# Patient Record
Sex: Male | Born: 1995 | Race: Black or African American | Hispanic: No | Marital: Single | Smoking: Current every day smoker
Health system: Southern US, Community
[De-identification: ages and names within clinical notes are randomized; demographics above are authoritative.]

## PROBLEM LIST (undated history)

## (undated) ENCOUNTER — Ambulatory Visit (HOSPITAL_COMMUNITY): Admission: EM

## (undated) HISTORY — PX: APPENDECTOMY: SHX54

---
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
## 2014-01-16 ENCOUNTER — Emergency Department: Payer: Self-pay

## 2014-07-24 ENCOUNTER — Emergency Department: Payer: Self-pay | Admitting: Emergency Medicine

## 2014-07-25 ENCOUNTER — Emergency Department: Payer: Self-pay | Admitting: Emergency Medicine

## 2014-07-27 ENCOUNTER — Emergency Department: Payer: Self-pay | Admitting: Emergency Medicine

## 2014-07-28 ENCOUNTER — Emergency Department (HOSPITAL_COMMUNITY)
Admission: EM | Admit: 2014-07-28 | Discharge: 2014-07-28 | Disposition: A | Discharge status: Home / Self Care | Payer: Medicaid Other | Attending: Emergency Medicine | Admitting: Emergency Medicine

## 2014-07-28 ENCOUNTER — Encounter (HOSPITAL_COMMUNITY): Payer: Self-pay | Admitting: Emergency Medicine

## 2014-07-28 DIAGNOSIS — L02619 Cutaneous abscess of unspecified foot: Secondary | ICD-10-CM | POA: Diagnosis not present

## 2014-07-28 DIAGNOSIS — M79609 Pain in unspecified limb: Secondary | ICD-10-CM | POA: Insufficient documentation

## 2014-07-28 DIAGNOSIS — L02611 Cutaneous abscess of right foot: Secondary | ICD-10-CM

## 2014-07-28 DIAGNOSIS — L03119 Cellulitis of unspecified part of limb: Principal | ICD-10-CM

## 2014-07-28 MED ORDER — LIDOCAINE HCL (PF) 2 % IJ SOLN
2.0000 mL | Freq: Once | INTRAMUSCULAR | Status: AC
  Administered 2014-07-28: 2 mL

## 2014-07-28 NOTE — ED Notes (Signed)
Dr. Glick at bedside.  

## 2014-07-28 NOTE — ED Provider Notes (Signed)
CSN: [id number]     Arrival date & time 07/28/14  4098 History   First MD Initiated Contact with Patient 07/28/14 [phone]     Chief Complaint  Patient presents with  . Foot Pain    Right     (Consider location/radiation/quality/duration/timing/severity/associated sxs/prior Treatment) Patient is a 18 y.o. male presenting with lower extremity pain. The history is provided by the patient.  Foot Pain  He developed a blister on the sole of his right foot. He went to another emergency department where they put a needle into it and started to drain some purulent material but he states it started hurting to procedure consent back and he laughed. He was placed on an antibiotic. Since then, and the swollen area has gotten larger and is more painful. He rates pain at 10/10. He denies fever or chills.  History reviewed. No pertinent past medical history. Past Surgical History  Procedure Laterality Date  . Appendectomy     No family history on file. History  Substance Use Topics  . Smoking status: Never Smoker   . Smokeless tobacco: Not on file  . Alcohol Use: No    Review of Systems  All other systems reviewed and are negative.     Allergies  Review of patient's allergies indicates no known allergies.  Home Medications   Prior to Admission medications   Not on File   BP 147/76  Pulse 54  Temp(Src) 98 F (36.7 C) (Oral)  Resp 17  Ht 6' (1.829 m)  Wt 166 lb (75.297 kg)  BMI 22.51 kg/m2  SpO2 100% Physical Exam  Nursing note and vitals reviewed.  18 year old male, resting comfortably and in no acute distress. Vital signs are significant for bradycardia and hypertension. Oxygen saturation is 100%, which is normal. Head is normocephalic and atraumatic. PERRLA, EOMI. Oropharynx is clear. Neck is nontender and supple without adenopathy or JVD. Back is nontender and there is no CVA tenderness. Lungs are clear without rales, wheezes, or rhonchi. Chest is nontender. Heart has  regular rate and rhythm without murmur. Abdomen is soft, flat, nontender without masses or hepatosplenomegaly and peristalsis is normoactive. Extremities have no cyanosis or edema, full range of motion is present. There is a raised, fluctuant area on the plantar surface of the right foot overlying the second and third MTP joints. There is no erythema or warmth. Skin is warm and dry without rash. Neurologic: Mental status is normal, cranial nerves are intact, there are no motor or sensory deficits.  ED Course  Procedures (including critical care time) INCISION AND DRAINAGE Performed by: YNWGN,FAOZH Consent: Verbal consent obtained. Risks and benefits: risks, benefits and alternatives were discussed Type: abscess  Body area: right foot  Anesthesia: local infiltration  Incision was made with a scalpel.  Local anesthetic: lidocaine 2% without epinephrine  Anesthetic total: 2 ml  Complexity: simple  Drainage: purulent  Drainage amount: moderate  Packing material: none  Patient tolerance: Patient tolerated the procedure well with no immediate complications.    MDM   Final diagnoses:  Abscess of right foot excluding toes    Blister on the plantar surface of the right foot. However, since there is a report of some purulent material having been obtained for him at, attempts will be made to aspirate this.    Dione Booze, MD 07/28/14 [id number]

## 2014-07-28 NOTE — Discharge Instructions (Signed)
Continue taking the antibiotic you were prescribed yesterday. Soak the foot in warm water several times a day.   Abscess An abscess is an infected area that contains a collection of pus and debris.It can occur in almost any part of the body. An abscess is also known as a furuncle or boil. CAUSES  An abscess occurs when tissue gets infected. This can occur from blockage of oil or sweat glands, infection of hair follicles, or a minor injury to the skin. As the body tries to fight the infection, pus collects in the area and creates pressure under the skin. This pressure causes pain. People with weakened immune systems have difficulty fighting infections and get certain abscesses more often.  SYMPTOMS Usually an abscess develops on the skin and becomes a painful mass that is red, warm, and tender. If the abscess forms under the skin, you may feel a moveable soft area under the skin. Some abscesses break open (rupture) on their own, but most will continue to get worse without care. The infection can spread deeper into the body and eventually into the bloodstream, causing you to feel ill.  DIAGNOSIS  Your caregiver will take your medical history and perform a physical exam. A sample of fluid may also be taken from the abscess to determine what is causing your infection. TREATMENT  Your caregiver may prescribe antibiotic medicines to fight the infection. However, taking antibiotics alone usually does not cure an abscess. Your caregiver may need to make a small cut (incision) in the abscess to drain the pus. In some cases, gauze is packed into the abscess to reduce pain and to continue draining the area. HOME CARE INSTRUCTIONS   Only take over-the-counter or prescription medicines for pain, discomfort, or fever as directed by your caregiver.  If you were prescribed antibiotics, take them as directed. Finish them even if you start to feel better.  If gauze is used, follow your caregiver's directions for  changing the gauze.  To avoid spreading the infection:  Keep your draining abscess covered with a bandage.  Wash your hands well.  Do not share personal care items, towels, or whirlpools with others.  Avoid skin contact with others.  Keep your skin and clothes clean around the abscess.  Keep all follow-up appointments as directed by your caregiver. SEEK MEDICAL CARE IF:   You have increased pain, swelling, redness, fluid drainage, or bleeding.  You have muscle aches, chills, or a general ill feeling.  You have a fever. MAKE SURE YOU:   Understand these instructions.  Will watch your condition.  Will get help right away if you are not doing well or get worse. Document Released: 08/02/2005 Document Revised: 04/23/2012 Document Reviewed: 01/05/2012 [employer] Patient Information 2015 [place], [place]. This information is not intended to replace advice given to you by your health care provider. Make sure you discuss any questions you have with your health care provider.

## 2014-07-28 NOTE — ED Notes (Signed)
Pt coming from home with c/o of right foot pain with a bole on the sole of the foot.  Pt was seen at [facility] yesterday with similar c/o.  Pt states the bole has gotten bigger since yesterday.

## 2016-04-04 ENCOUNTER — Emergency Department: Payer: Medicaid Other

## 2016-04-04 ENCOUNTER — Emergency Department
Admission: EM | Admit: 2016-04-04 | Discharge: 2016-04-04 | Disposition: A | Discharge status: Home / Self Care | Payer: Medicaid Other | Attending: Emergency Medicine | Admitting: Emergency Medicine

## 2016-04-04 ENCOUNTER — Encounter: Payer: Self-pay | Admitting: Emergency Medicine

## 2016-04-04 DIAGNOSIS — S99922A Unspecified injury of left foot, initial encounter: Secondary | ICD-10-CM | POA: Diagnosis present

## 2016-04-04 DIAGNOSIS — Y939 Activity, unspecified: Secondary | ICD-10-CM | POA: Insufficient documentation

## 2016-04-04 DIAGNOSIS — Y929 Unspecified place or not applicable: Secondary | ICD-10-CM | POA: Insufficient documentation

## 2016-04-04 DIAGNOSIS — W228XXA Striking against or struck by other objects, initial encounter: Secondary | ICD-10-CM | POA: Diagnosis not present

## 2016-04-04 DIAGNOSIS — F17200 Nicotine dependence, unspecified, uncomplicated: Secondary | ICD-10-CM | POA: Diagnosis not present

## 2016-04-04 DIAGNOSIS — S92311A Displaced fracture of first metatarsal bone, right foot, initial encounter for closed fracture: Secondary | ICD-10-CM | POA: Insufficient documentation

## 2016-04-04 DIAGNOSIS — Y999 Unspecified external cause status: Secondary | ICD-10-CM | POA: Insufficient documentation

## 2016-04-04 DIAGNOSIS — S9031XA Contusion of right foot, initial encounter: Secondary | ICD-10-CM

## 2016-04-04 MED ORDER — TRAMADOL HCL 50 MG PO TABS: 50.0000 mg | ORAL_TABLET | Freq: Four times a day (QID) | ORAL | Status: DC | PRN

## 2016-04-04 NOTE — ED Provider Notes (Signed)
[facility] Emergency Department Provider Note  ____________________________________________  Time seen: Approximately 7:07 AM  I have reviewed the triage vital signs and the nursing notes.   HISTORY  Chief Complaint Foot Pain    HPI Steven Deleon is a 20 y.o. male , NAD, presents to the emergency department with one-day history of right foot pain. States that a grill fell over and onto his right foot yesterday. Has pain about the top part of the right foot as well as around the arch. Has been able to bear weight but with pain. Denies any numbness, weakness, tingling. Does not have any open wounds or lacerations to the foot. Has not completed any supportive care at this time. Denies any previous injuries. Denies pain about the right toes, ankle, lower leg.   History reviewed. No pertinent past medical history.  There are no active problems to display for this patient.   Past Surgical History  Procedure Laterality Date  . Appendectomy      Current Outpatient Rx  Name  Route  Sig  Dispense  Refill  . traMADol (ULTRAM) 50 MG tablet   Oral   Take 1 tablet (50 mg total) by mouth every 6 (six) hours as needed.   6 tablet   0     Allergies Review of patient's allergies indicates no known allergies.  No family history on file.  Social History Social History  Substance Use Topics  . Smoking status: Current Every Day Smoker  . Smokeless tobacco: None  . Alcohol Use: No     Review of Systems  Constitutional: No fatigue Cardiovascular: No chest pain. Respiratory: No shortness of breath.  Musculoskeletal: Positive right foot pain. Negative right toes, ankle, lower leg pain. Skin: Negative for rash, bruising, swelling, redness, open wounds, skin sores. Neurological: Negative for headaches, focal weakness or numbness. No tingling   ____________________________________________   PHYSICAL EXAM:  VITAL SIGNS: ED Triage Vitals  Enc  Vitals Group     BP --      Pulse --      Resp --      Temp --      Temp src --      SpO2 --      Weight --      Height --      Head Cir --      Peak Flow --      Pain Score 04/04/16 0706 7     Pain Loc --      Pain Edu? --      Excl. in GC? --      Constitutional: Alert and oriented. Well appearing and in no acute distress. Eyes: Conjunctivae are normal.  Head: Atraumatic. Cardiovascular: Good peripheral circulation with 2+ pulses noted in the right lower extremity. Capillary refill is brisk in the right lower extremity. Respiratory: Normal respiratory effort without tachypnea or retractions.  Musculoskeletal: Tenderness to palpation about the first, second, and third metacarpals without any bony abnormality or crepitus. Full range of motion of the right toes and ankle with mild pain about the top of the right foot medially. No pain about the sole of the foot with palpation. No lower extremity tenderness nor edema.  No joint effusions. Neurologic:  Normal speech and language. No gross focal neurologic deficits are appreciated. Sensation is grossly intact about the right lower extremity to light touch. Skin:  Skin is warm, dry and intact. No rash, redness, bruising, swelling, open wounds or lacerations noted. Psychiatric:  Mood and affect are normal. Speech and behavior are normal. Patient exhibits appropriate insight and judgement.   ____________________________________________   LABS  None ____________________________________________  EKG  None ____________________________________________  RADIOLOGY I have personally viewed and evaluated these images (plain radiographs) as part of my medical decision making, as well as reviewing the written report by the radiologist.  Dg Foot Complete Right  04/04/2016  CLINICAL DATA:  20 year old who had a grill fall onto the right foot yesterday. Dorsal and medial pain. Initial encounter. EXAM: RIGHT FOOT COMPLETE - 3+ VIEW COMPARISON:   07/24/2014. FINDINGS: Calcifications adjacent to the medial cuneiform-1st metatarsal joint with overlying soft tissue swelling. No evidence of acute fracture or dislocation elsewhere. Well preserved bone mineral density. No other intrinsic osseous abnormalities. IMPRESSION: Calcification involving the capsule of the medial cuneiform-1st metatarsal joint versus avulsion fracture arising from the medial cuneiform. Please correlate with point tenderness. Otherwise normal examination. Electronically Signed   By: Hulan Saashomas  Lawrence M.D.   On: 04/04/2016 07:27    ____________________________________________    PROCEDURES  Procedure(s) performed: None   Medications - No data to display   ____________________________________________   INITIAL IMPRESSION / ASSESSMENT AND PLAN / ED COURSE  Pertinent imaging results that were available during my care of the patient were reviewed by me and considered in my medical decision making (see chart for details).  Patient's diagnosis is consistent with avulsion first metatarsal. Patient was placed in a posterior Ortho-Glass splint and given crutches to assist in ambulation. Patient will be discharged home with instructions to take Tylenol or ibuprofen as needed for pain. Patient was given a prescription for Ultram to use sparingly as needed for severe pain. Should apply ice to the affected area 20 minutes 3-4 times daily and keep it elevated as needed. Patient is to follow up with Dr. Rosita KeaMenz, in orthopedics, in 3 days for recheck. Patient is given ED precautions to return to the ED for any worsening or new symptoms.    ____________________________________________  FINAL CLINICAL IMPRESSION(S) / ED DIAGNOSES  Final diagnoses:  Fracture of first metatarsal bone, right, closed, initial encounter  Contusion of right foot, initial encounter      NEW MEDICATIONS STARTED DURING THIS VISIT:  New Prescriptions   TRAMADOL (ULTRAM) 50 MG TABLET    Take 1  tablet (50 mg total) by mouth every 6 (six) hours as needed.         Hope PigeonJami L Charbel Los, PA-C 04/04/16 [id number]  Richardean Canalavid H Yao, MD 04/04/16 2158

## 2016-04-04 NOTE — ED Notes (Signed)
States he had a grill fall onto right foot yesterday  Having pain to top of foot

## 2016-04-04 NOTE — Discharge Instructions (Signed)
Foot Contusion A foot contusion is a deep bruise to the foot. Contusions are the result of an injury that caused bleeding under the skin. The contusion may turn blue, purple, or yellow. Minor injuries will give you a painless contusion, but more severe contusions may stay painful and swollen for a few weeks. CAUSES  A foot contusion comes from a direct blow to that area, such as a heavy object falling on the foot. SYMPTOMS   Swelling of the foot.  Discoloration of the foot.  Tenderness or soreness of the foot. DIAGNOSIS  You will have a physical exam and will be asked about your history. You may need an X-ray of your foot to look for a broken bone (fracture).  TREATMENT  An elastic wrap may be recommended to support your foot. Resting, elevating, and applying cold compresses to your foot are often the best treatments for a foot contusion. Over-the-counter medicines may also be recommended for pain control. HOME CARE INSTRUCTIONS   Put ice on the injured area.  Put ice in a plastic bag.  Place a towel between your skin and the bag.  Leave the ice on for 15-20 minutes, 03-04 times a day.  Only take over-the-counter or prescription medicines for pain, discomfort, or fever as directed by your caregiver.  If told, use an elastic wrap as directed. This can help reduce swelling. You may remove the wrap for sleeping, showering, and bathing. If your toes become numb, cold, or blue, take the wrap off and reapply it more loosely.  Elevate your foot with pillows to reduce swelling.  Try to avoid standing or walking while the foot is painful. Do not resume use until instructed by your caregiver. Then, begin use gradually. If pain develops, decrease use. Gradually increase activities that do not cause discomfort until you have normal use of your foot.  See your caregiver as directed. It is very important to keep all follow-up appointments in order to avoid any lasting problems with your foot,  including long-term (chronic) pain. SEEK IMMEDIATE MEDICAL CARE IF:   You have increased redness, swelling, or pain in your foot.  Your swelling or pain is not relieved with medicines.  You have loss of feeling in your foot or are unable to move your toes.  Your foot turns cold or blue.  You have pain when you move your toes.  Your foot becomes warm to the touch.  Your contusion does not improve in 2 days. MAKE SURE YOU:   Understand these instructions.  Will watch your condition.  Will get help right away if you are not doing well or get worse.   This information is not intended to replace advice given to you by your health care provider. Make sure you discuss any questions you have with your health care provider.   Document Released: 08/14/2006 Document Revised: 04/23/2012 Document Reviewed: 06/29/2015 Elsevier Interactive Patient Education 2016 [employer]  Metatarsal Fracture With Rehab A metatarsal fracture is a broken bone in one of the five bones that connect your toes to the rest of your foot (forefoot fracture). Metatarsals are long bones that can be stressed or cracked easily. A metatarsal fracture can be:  A stress fracture. Stress fractures are cracks in the surface of the metatarsal bone. Athletes often get stress fractures.  A complete fracture. A complete fracture goes all the way through the bone. The bone that connects to the pinky toe (fifth metatarsal) is the most commonly fractured metatarsal. Ballet dancers often  fracture this bone. CAUSES  Stress fractures may be caused by:  Poor training technique.  Sudden increase in activity.  Changing your activity to a harder surface.  Wearing athletic shoes that do not have enough cushioning. Complete fractures are usually caused by:  Dropping a heavy object on your foot.  An injury that severely twists your foot. SIGNS AND SYMPTOMS The most common symptom of a stress fracture is foot pain that goes  away with rest. The most common symptom of a complete fracture is intense pain that persists after the injury. Other symptoms of both fracture types include:  Bruising.  Swelling.  Pain with movement or putting weight on the foot.  Tenderness or pain with pressure.  Trouble walking. DIAGNOSIS  Your health care provider may suspect a metatarsal fracture based on your symptoms and medical history. Your health care provider will also do a physical exam. During the physical exam, your health care provider may try to move your foot and toes to check for pain and limited movement. Your foot will also be checked for:  Bruising.  Tenderness.  Swelling.  Deformity. Other tests that may be done include:  X-rays. X-rays are able to show most fractures.  A bone scan. This test may be necessary to show a stress fracture. TREATMENT  Treatment for a metatarsal fracture depends on how severe the fracture was and the type of fracture. Treatment may include:  Surgery. Surgery is usually needed to repair a displaced fracture. A displaced fracture happens when pieces of the broken bone are moved out of place (displacement). After surgery, you may need to wear a short walking cast for 6 to 8 weeks.  Medicines. Medicines may be used to reduce swelling and pain.  Physical therapy. Physical therapy may last for several months.  Use of a supportive device, such as:  Elastic wrap.  Splint.  Boot.  Cast.  Crutches to support weight on your foot until the broken bone heals. You can usually treat a stress fracture or a nondisplaced fracture with:   Rest.  Ice.  Elevation.  Support. HOME CARE INSTRUCTIONS  Follow all your health care provider's instructions.  Take medicine only as directed by your health care provider.  Rest your foot until your health care provider says you can resume your usual activities.  When resting, keep your foot raised above the level of your heart  (elevated).  Ice may help reduce pain and swelling.  Place ice in a plastic bag.  Place a towel between your skin and the bag.  Leave the ice on for 20 minutes, 2-3 times a day.  Wear your supportive device as directed.  Do not get your cast or splint wet.  Keep all follow-up visits as directed by your health care provider. If your health care provider recommended physical therapy, it is very important for proper healing of your injury that you keep all visits. PREVENTION  After your fracture has healed, you can prevent another fracture by:  Starting new sports activities gradually.  Cross training to avoid putting stress on the same part of your foot every day (such as alternate running with swimming or biking).  Eat a healthy diet that includes plenty of calcium and vitamin D.  Wear the right athletic shoes for your sport. Replace them when they wear out.  Stop your activity or training if you have pain or swelling in your foot. Rest for a few days. SEEK MEDICAL CARE IF:  You have pain that  is getting worse.  You develop chills or fever.  Your foot feels numb.  Your cast or splint is damaged.  You notice swelling or redness below your cast or splint. WHAT REHABILITATION EXERCISES CAN I DO AT HOME? Ask your health care provider or physical therapist when you can start doing exercises at home. Doing these exercises 3-5 times a week can help you regain strength and flexibility. If doing any of these exercises causes pain, stop and contact your health care provider or physical therapist. Heel Cord Stretch Do 2 sets of 10 repetitions.  Stand facing a wall with your healthy foot forward and your knee slightly bent.  Place both hands on the wall for support.  Stretch your healing foot out straight behind you.  Keep both heels on the floor.  Hold the stretch for 30 seconds.  You can also do this exercise with both knees slightly bent. Repeat the same steps. Golf Land O'LakesBall  Roll Do this once every day.  Sit on a chair and roll a golf ball under your healing foot for two minutes. Towel Stretch  Sit on the floor with your legs out straight.  Loop a towel around the front of your healing foot.  Use both hands to pull the ends of the towel, stretching your foot back toward your body.  Hold the stretch for 30 seconds and repeat 3 times. Calf Raises Do 2 sets of 10 repetitions.  Stand behind a chair and use your hands to support you.  Lift your good foot off the ground.  Rise up on the front of your healing foot as high as you can.  Repeat the lift 10 times. Marble Pickup Do this once a day.  Sit in a chair and put 20 marbles on the floor in front of you.  Use the toes of your healing foot to pick up each marble. Towel Curls Repeat this exercise 5 times.  Sit in a chair and place a small towel on the floor in front of you.  Use the toes of your healing foot to grab the towel and pull it toward you.   This information is not intended to replace advice given to you by your health care provider. Make sure you discuss any questions you have with your health care provider.   Document Released: 10/23/2005 Document Revised: 03/09/2015 Document Reviewed: 02/05/2014 Elsevier Interactive Patient Education [employer]2016 [employer]

## 2016-05-15 DIAGNOSIS — R1110 Vomiting, unspecified: Secondary | ICD-10-CM | POA: Insufficient documentation

## 2016-05-15 DIAGNOSIS — F17210 Nicotine dependence, cigarettes, uncomplicated: Secondary | ICD-10-CM | POA: Insufficient documentation

## 2016-05-15 DIAGNOSIS — R197 Diarrhea, unspecified: Secondary | ICD-10-CM | POA: Insufficient documentation

## 2016-05-16 ENCOUNTER — Emergency Department
Admission: EM | Admit: 2016-05-16 | Discharge: 2016-05-16 | Disposition: A | Discharge status: Home / Self Care | Payer: Medicaid Other | Attending: Emergency Medicine | Admitting: Emergency Medicine

## 2016-05-16 ENCOUNTER — Encounter: Payer: Self-pay | Admitting: Emergency Medicine

## 2016-05-16 DIAGNOSIS — R1110 Vomiting, unspecified: Secondary | ICD-10-CM

## 2016-05-16 DIAGNOSIS — R197 Diarrhea, unspecified: Secondary | ICD-10-CM

## 2016-05-16 LAB — COMPREHENSIVE METABOLIC PANEL
ALK PHOS: 58 U/L (ref 38–126)
ALT: 12 U/L — ABNORMAL LOW (ref 17–63)
ANION GAP: 7 (ref 5–15)
AST: 19 U/L (ref 15–41)
Albumin: 4.2 g/dL (ref 3.5–5.0)
BILIRUBIN TOTAL: 0.9 mg/dL (ref 0.3–1.2)
BUN: 8 mg/dL (ref 6–20)
CALCIUM: 8.9 mg/dL (ref 8.9–10.3)
CO2: 27 mmol/L (ref 22–32)
Chloride: 104 mmol/L (ref 101–111)
Creatinine, Ser: 0.91 mg/dL (ref 0.61–1.24)
GFR calc Af Amer: >60 mL/min (ref >60)
GFR calc non Af Amer: >60 mL/min (ref >60)
Glucose, Bld: 101 mg/dL — ABNORMAL HIGH (ref 65–99)
Potassium: 3.8 mmol/L (ref 3.5–5.1)
Sodium: 138 mmol/L (ref 135–145)
TOTAL PROTEIN: 7.6 g/dL (ref 6.5–8.1)

## 2016-05-16 LAB — CBC
HCT: 40 % (ref 40.0–52.0)
HEMOGLOBIN: 13.8 g/dL (ref 13.0–18.0)
MCH: 30.8 pg (ref 26.0–34.0)
MCHC: 34.4 g/dL (ref 32.0–36.0)
MCV: 89.4 fL (ref 80.0–100.0)
Platelets: 233 10*3/uL (ref 150–440)
RBC: 4.48 MIL/uL (ref 4.40–5.90)
RDW: 14.4 % (ref 11.5–14.5)
WBC: 6.2 10*3/uL (ref 3.8–10.6)

## 2016-05-16 LAB — LIPASE, BLOOD: Lipase: 17 U/L (ref 11–51)

## 2016-05-16 MED ORDER — ONDANSETRON 4 MG PO TBDP: 4.0000 mg | ORAL_TABLET | Freq: Three times a day (TID) | ORAL | Status: DC | PRN

## 2016-05-16 NOTE — ED Provider Notes (Signed)
[facility] Emergency Department Provider Note   ____________________________________________  Time seen: Approximately 430 AM  I have reviewed the triage vital signs and the nursing notes.   HISTORY  Chief Complaint Abdominal Pain and Diarrhea    HPI Steven Deleon is a 20 y.o. male who comes into the hospital today with stomach pain and diarrhea. The patient reports it started yesterday. His diarrhea has been green and he reports that he's had 10 episodes. He denies any vomiting today but did have some vomiting yesterday. He is able to keep down fluids at this time. He reports that he has some cramping pain under his belly button but only occurs right before he has the diarrhea. He reports that he has not taken any medication for the diarrhea. He denies sick contacts but reports that he ate at [employer] which she hadn't eaten apple 4. The patient denies any current pain, he's had no fevers, no chest pain or shortness of breath or any other concerns. The patient's last bowel movement was around 1 AM.   History reviewed. No pertinent past medical history.  There are no active problems to display for this patient.   Past Surgical History  Procedure Laterality Date  . Appendectomy      Current Outpatient Rx  Name  Route  Sig  Dispense  Refill  . ondansetron (ZOFRAN ODT) 4 MG disintegrating tablet   Oral   Take 1 tablet (4 mg total) by mouth every 8 (eight) hours as needed for nausea or vomiting.   20 tablet   0   . VYVANSE 50 MG capsule   Oral   Take 50 mg by mouth daily.      0     Dispense as written.     Allergies Review of patient's allergies indicates no known allergies.  No family history on file.  Social History Social History  Substance Use Topics  . Smoking status: Current Every Day Smoker -- 0.50 packs/day    Types: Cigarettes  . Smokeless tobacco: None  . Alcohol Use: No    Review of Systems Constitutional: No  fever/chills Eyes: No visual changes. ENT: No sore throat. Cardiovascular: Denies chest pain. Respiratory: Denies shortness of breath. Gastrointestinal:  abdominal pain.  nausea,  vomiting.  diarrhea.  No constipation. Genitourinary: Negative for dysuria. Musculoskeletal: Negative for back pain. Skin: Negative for rash. Neurological: Negative for headaches, focal weakness or numbness.  10-point ROS otherwise negative.  ____________________________________________   PHYSICAL EXAM:  VITAL SIGNS: ED Triage Vitals  Enc Vitals Group     BP 05/16/16 0011 130/57 mmHg     Pulse Rate 05/16/16 0011 77     Resp 05/16/16 0011 18     Temp 05/16/16 0011 98.4 F (36.9 C)     Temp Source 05/16/16 0011 Oral     SpO2 05/16/16 0011 99 %     Weight 05/16/16 0011 170 lb (77.111 kg)     Height 05/16/16 0011 5\' 10"  (1.778 m)     Head Cir --      Peak Flow --      Pain Score 05/16/16 0011 6     Pain Loc --      Pain Edu? --      Excl. in GC? --     Constitutional: Alert and oriented. Well appearing and in no acute distress. Eyes: Conjunctivae are normal. PERRL. EOMI. Head: Atraumatic. Nose: No congestion/rhinnorhea. Mouth/Throat: Mucous membranes are moist.  Oropharynx non-erythematous. Cardiovascular:  Normal rate, regular rhythm. Grossly normal heart sounds.  Good peripheral circulation. Respiratory: Normal respiratory effort.  No retractions. Lungs CTAB. Gastrointestinal: Soft and nontender. No distention. Positive bowel sounds Musculoskeletal: No lower extremity tenderness nor edema.   Neurologic:  Normal speech and language.  Skin:  Skin is warm, dry and intact.  Psychiatric: Mood and affect are normal.   ____________________________________________   LABS (all labs ordered are listed, but only abnormal results are displayed)  Labs Reviewed  COMPREHENSIVE METABOLIC PANEL - Abnormal; Notable for the following:    Glucose, Bld 101 (*)    ALT 12 (*)    All other components within  normal limits  LIPASE, BLOOD  CBC  URINALYSIS COMPLETEWITH MICROSCOPIC ([facility] ONLY)   ____________________________________________  EKG  none ____________________________________________  RADIOLOGY  none ____________________________________________   PROCEDURES  Procedure(s) performed: None  Procedures  Critical Care performed: No  ____________________________________________   INITIAL IMPRESSION / ASSESSMENT AND PLAN / ED COURSE  Pertinent labs & imaging results that were available during my care of the patient were reviewed by me and considered in my medical decision making (see chart for details).  This is a 20 year old male who comes into the hospital today with some vomiting and diarrhea. The patient's vomiting is resolved and he is not having pain at this time. He reports it is just pain when he has diarrhea. It is been numerous hours since the patient has had any diarrhea at this time. The patient had some blood work done that was negative. He is not tachycardic nor is he hypotensive. He is not showing any visible signs and he has any dehydration. As the patient's blood work looks good and he clinically looks well at her not feel that we need any imaging at this time. The patient did ask for a work note so I will discharge him to home to follow-up with his primary care physician. I will give the patient some Zofran in the event that he does have some more vomiting but he is able to take by mouth at this time. ____________________________________________   FINAL CLINICAL IMPRESSION(S) / ED DIAGNOSES  Final diagnoses:  Vomiting and diarrhea      NEW MEDICATIONS STARTED DURING THIS VISIT:  Discharge Medication List as of 05/16/2016  5:18 AM    START taking these medications   Details  ondansetron (ZOFRAN ODT) 4 MG disintegrating tablet Take 1 tablet (4 mg total) by mouth every 8 (eight) hours as needed for nausea or vomiting., Starting 05/16/2016, Until  Discontinued, Print         Note:  This document was prepared using Dragon voice recognition software and may include unintentional dictation errors.    Rebecka Apley, MD 05/16/16 [phone]

## 2016-05-16 NOTE — ED Notes (Addendum)
Pt presents to ED with frequent diarrhea (>10X today) and generalized abd cramping today. Denies pain with palpation or fever.

## 2016-05-16 NOTE — ED Notes (Signed)
Pt unable to urinate. Specimen cup given and asked to bring it to front desk when did.

## 2016-05-16 NOTE — ED Notes (Signed)
No urine sample yet, pt reminded of need for urine sample.

## 2016-05-16 NOTE — ED Notes (Signed)
Pt presents with abdominal pain that is centrally located and N&V. Pt states he vomited 3 times. Pt did not mention diarrhea or fevers when asked. Unsure if ate something that made him sick. Pt is lying on stretcher in no distress. Pt has urine specimen cup at bedside, aware of need for sample.

## 2016-05-16 NOTE — Discharge Instructions (Signed)

## 2017-05-02 ENCOUNTER — Emergency Department (HOSPITAL_COMMUNITY)
Admission: EM | Admit: 2017-05-02 | Discharge: 2017-05-02 | Disposition: A | Discharge status: Home / Self Care | Payer: Medicaid Other | Attending: Emergency Medicine | Admitting: Emergency Medicine

## 2017-05-02 ENCOUNTER — Encounter (HOSPITAL_COMMUNITY): Payer: Self-pay

## 2017-05-02 DIAGNOSIS — R001 Bradycardia, unspecified: Secondary | ICD-10-CM | POA: Insufficient documentation

## 2017-05-02 DIAGNOSIS — F17210 Nicotine dependence, cigarettes, uncomplicated: Secondary | ICD-10-CM | POA: Insufficient documentation

## 2017-05-02 DIAGNOSIS — N489 Disorder of penis, unspecified: Secondary | ICD-10-CM | POA: Insufficient documentation

## 2017-05-02 NOTE — ED Provider Notes (Signed)
[facility]-EMERGENCY DEPT Provider Note   CSN: [id number] Arrival date & time: 05/02/17  1704  By signing my name below, I, Phillips ClimesFabiola de Louis, attest that this documentation has been prepared under the direction and in the presence of Kerrie BuffaloHope Neese, NP .  Electronically Signed: Phillips ClimesFabiola de [place], Scribe. 05/02/2017. 7:35 PM.  History   Chief Complaint Chief Complaint  Patient presents with  . rash to penis   HPI Comments Steven Deleon is a 21 y.o. male with no reported PMHx, who presents to the Emergency Department with complaints of painful, draining pustules to his penis x4 days.  He estimates that there are 3 or 4 different spots on his penile shaft. Pt reports having protected sex x1 wk with a male partner.  He had unprotected oral sex at that time. No dysuria, penile d/c.  No fevers or chills.  No hx of similar sx.  No nausea, vomiting or abdominal pain.  The history is provided by the patient.   History reviewed. No pertinent past medical history.  There are no active problems to display for this patient.  Past Surgical History:  Procedure Laterality Date  . APPENDECTOMY      Home Medications    Prior to Admission medications   Medication Sig Start Date End Date Taking? Authorizing Provider  ondansetron (ZOFRAN ODT) 4 MG disintegrating tablet Take 1 tablet (4 mg total) by mouth every 8 (eight) hours as needed for nausea or vomiting. 05/16/16   Rebecka ApleyWebster, Allison P, MD  VYVANSE 50 MG capsule Take 50 mg by mouth daily. 02/22/16   [provider]    Family History No family history on file.  Social History Social History  Substance Use Topics  . Smoking status: Current Every Day Smoker    Packs/day: 0.50    Types: Cigarettes  . Smokeless tobacco: Never Used  . Alcohol use No     Allergies   Patient has no known allergies.   Review of Systems Review of Systems  Constitutional: Negative for chills and fever.  Gastrointestinal: Negative for abdominal pain,  nausea and vomiting.  Genitourinary: Positive for genital sores and penile pain. Negative for difficulty urinating, discharge, dysuria, scrotal swelling and testicular pain.    Physical Exam Updated Vital Signs BP (!) 145/73 (BP Location: Right Arm)   Pulse (!) 53   Temp 98.4 F (36.9 C) (Oral)   Resp 19   SpO2 100%   Physical Exam  Constitutional: He appears well-developed and well-nourished. No distress.  HENT:  Head: Normocephalic.  Eyes: EOM are normal.  Neck: Neck supple.  Cardiovascular: Bradycardia present.   Pulmonary/Chest: Effort normal.  Abdominal: Soft. There is no tenderness.  Genitourinary:  Genitourinary Comments: Chaperone present throughout exam.  Circumcised male with multiple tiny vesicle areas to the shaft of the penis.  BB sized inguinal nodes bilaterally.  Musculoskeletal: Normal range of motion.  Neurological: He is alert.  Skin: Skin is warm and dry.  Nursing note and vitals reviewed.  ED Treatments / Results  DIAGNOSTIC STUDIES: Oxygen Saturation is 100% on room air, normal by my interpretation.    COORDINATION OF CARE: 6:33 PM Discussed treatment plan with pt at bedside and pt agreed to plan.  Labs (all labs ordered are listed, but only abnormal results are displayed) Labs Reviewed  HSV CULTURE AND TYPING  RPR  HIV ANTIBODY (ROUTINE TESTING)  GC/CHLAMYDIA PROBE AMP ([place]) NOT AT [facility]   Radiology No results found.  Procedures Procedures (including critical care time)  Medications Ordered in ED Medications - No data to display   Initial Impression / Assessment and Plan / ED Course  I have reviewed the triage vital signs and the nursing notes.  At this time there does not appear to be any evidence of an acute emergency medical condition and the patient appears stable for discharge with appropriate outpatient follow up. Diagnosis was discussed with patient who verbalizes understanding and is agreeable to discharge. Cultures sent  for GC, Chlamydia, HSV and blood drawn for HIV and Syphilis. Patient instructed to f/u with the [facility].    Final Clinical Impressions(s) / ED Diagnoses   Final diagnoses:  Penile lesion   I personally performed the services described in this documentation, which was scribed in my presence. The recorded information has been reviewed and is accurate.  New Prescriptions Discharge Medication List as of 05/02/2017  7:19 PM       Kerrie Buffalo [place], NP 05/03/17 1610    Loren Racer, MD 05/04/17 [phone]

## 2017-05-02 NOTE — Discharge Instructions (Signed)
We will only call you if your results are positive. You can also look on "My Chart" for your results.

## 2017-05-02 NOTE — ED Triage Notes (Signed)
Patient complains of 3 to 4 raised pustules on penis x 4 days with drainage. Burning to same. Denies dysuria.

## 2017-05-03 LAB — RPR: RPR Ser Ql: NONREACTIVE

## 2017-05-03 LAB — GC/CHLAMYDIA PROBE AMP (~~LOC~~) NOT AT ARMC
Chlamydia: POSITIVE — AB
Neisseria Gonorrhea: NEGATIVE

## 2017-05-03 LAB — HIV ANTIBODY (ROUTINE TESTING W REFLEX): HIV SCREEN 4TH GENERATION: NONREACTIVE

## 2017-05-05 LAB — HSV CULTURE AND TYPING

## 2018-04-05 ENCOUNTER — Emergency Department (HOSPITAL_COMMUNITY)
Admission: EM | Admit: 2018-04-05 | Discharge: 2018-04-05 | Disposition: A | Discharge status: Home / Self Care | Payer: Self-pay | Attending: Emergency Medicine | Admitting: Emergency Medicine

## 2018-04-05 ENCOUNTER — Encounter (HOSPITAL_COMMUNITY): Payer: Self-pay

## 2018-04-05 ENCOUNTER — Other Ambulatory Visit: Payer: Self-pay

## 2018-04-05 ENCOUNTER — Emergency Department (HOSPITAL_COMMUNITY): Payer: Self-pay

## 2018-04-05 DIAGNOSIS — J45909 Unspecified asthma, uncomplicated: Secondary | ICD-10-CM | POA: Insufficient documentation

## 2018-04-05 DIAGNOSIS — Z79899 Other long term (current) drug therapy: Secondary | ICD-10-CM | POA: Insufficient documentation

## 2018-04-05 DIAGNOSIS — R079 Chest pain, unspecified: Secondary | ICD-10-CM | POA: Insufficient documentation

## 2018-04-05 DIAGNOSIS — F17210 Nicotine dependence, cigarettes, uncomplicated: Secondary | ICD-10-CM | POA: Insufficient documentation

## 2018-04-05 LAB — BASIC METABOLIC PANEL
Anion gap: 11 (ref 5–15)
BUN: 6 mg/dL (ref 6–20)
CO2: 24 mmol/L (ref 22–32)
CREATININE: 1.01 mg/dL (ref 0.61–1.24)
Calcium: 9.4 mg/dL (ref 8.9–10.3)
Chloride: 101 mmol/L (ref 101–111)
GFR calc non Af Amer: >60 mL/min (ref >60)
Glucose, Bld: 77 mg/dL (ref 65–99)
POTASSIUM: 3.8 mmol/L (ref 3.5–5.1)
Sodium: 136 mmol/L (ref 135–145)

## 2018-04-05 LAB — CBC
HCT: 43.2 % (ref 39.0–52.0)
HEMOGLOBIN: 14.6 g/dL (ref 13.0–17.0)
MCH: 30.4 pg (ref 26.0–34.0)
MCHC: 33.8 g/dL (ref 30.0–36.0)
MCV: 90 fL (ref 78.0–100.0)
PLATELETS: 254 10*3/uL (ref 150–400)
RBC: 4.8 MIL/uL (ref 4.22–5.81)
RDW: 13.5 % (ref 11.5–15.5)
WBC: 4.5 10*3/uL (ref 4.0–10.5)

## 2018-04-05 LAB — I-STAT TROPONIN, ED
TROPONIN I, POC: 0 ng/mL (ref 0.00–0.08)
Troponin i, poc: 0.02 ng/mL (ref 0.00–0.08)

## 2018-04-05 NOTE — ED Provider Notes (Addendum)
[facility] EMERGENCY DEPARTMENT Provider Note   CSN: [id number] Arrival date & time: 04/05/18  1438     History   Chief Complaint No chief complaint on file.   HPI Steven Deleon is a 22 y.o. male.  Patient with remote history of asthma (childhood) presents with concern for chest discomfort for the past 4 days. No cough, SOB, fever, nausea or fatigue. Chest discomfort is described as central tightness, comes and goes, no modifying factors. He also describes sharp, right lower chest pain that hits him suddenly and disapates over a few minutes, also over the last 4 days. He is a smoker. He denies any sense of wheezing.   The history is provided by the patient. No language interpreter was used.    History reviewed. No pertinent past medical history.  There are no active problems to display for this patient.   Past Surgical History:  Procedure Laterality Date  . APPENDECTOMY          Home Medications    Prior to Admission medications   Medication Sig Start Date End Date Taking? Authorizing Provider  ranitidine (ZANTAC) 150 MG tablet Take 150 mg by mouth daily as needed for heartburn.   Yes [provider]  ondansetron (ZOFRAN ODT) 4 MG disintegrating tablet Take 1 tablet (4 mg total) by mouth every 8 (eight) hours as needed for nausea or vomiting. Patient not taking: Reported on 04/05/2018 05/16/16   Rebecka Apley, MD    Family History No family history on file.  Social History Social History   Tobacco Use  . Smoking status: Current Every Day Smoker    Packs/day: 0.50    Types: Cigarettes  . Smokeless tobacco: Never Used  Substance Use Topics  . Alcohol use: No  . Drug use: No     Allergies   Patient has no known allergies.   Review of Systems Review of Systems  Constitutional: Negative for chills and fever.  HENT: Negative.   Respiratory: Negative.  Negative for cough and shortness of breath.   Cardiovascular:  Positive for chest pain.  Gastrointestinal: Negative.  Negative for abdominal pain, nausea and vomiting.  Musculoskeletal: Negative.   Skin: Negative.   Neurological: Negative.      Physical Exam Updated Vital Signs BP (!) 144/80   Pulse 71   Temp 98.8 F (37.1 C) (Oral)   Resp 20   SpO2 100%   Physical Exam  Constitutional: He is oriented to person, place, and time. He appears well-developed and well-nourished.  HENT:  Head: Normocephalic.  Neck: Normal range of motion. Neck supple.  Cardiovascular: Normal rate and regular rhythm.  No murmur heard. Pulmonary/Chest: Effort normal and breath sounds normal. He has no wheezes. He has no rales. He exhibits no tenderness.  Abdominal: Soft. Bowel sounds are normal. There is no tenderness. There is no rebound and no guarding.  Musculoskeletal: Normal range of motion.  Neurological: He is alert and oriented to person, place, and time.  Skin: Skin is warm and dry. No rash noted.  Psychiatric: He has a normal mood and affect.     ED Treatments / Results  Labs (all labs ordered are listed, but only abnormal results are displayed) Labs Reviewed  BASIC METABOLIC PANEL  CBC  I-STAT TROPONIN, ED  I-STAT TROPONIN, ED   Results for orders placed or performed during the hospital encounter of 04/05/18  Basic metabolic panel  Result Value Ref Range   Sodium 136 135 - 145  mmol/L   Potassium 3.8 3.5 - 5.1 mmol/L   Chloride 101 101 - 111 mmol/L   CO2 24 22 - 32 mmol/L   Glucose, Bld 77 65 - 99 mg/dL   BUN 6 6 - 20 mg/dL   Creatinine, Ser 1.61 0.61 - 1.24 mg/dL   Calcium 9.4 8.9 - 09.6 mg/dL   GFR calc non Af Amer >60 >60 mL/min   GFR calc Af Amer >60 >60 mL/min   Anion gap 11 5 - 15  CBC  Result Value Ref Range   WBC 4.5 4.0 - 10.5 K/uL   RBC 4.80 4.22 - 5.81 MIL/uL   Hemoglobin 14.6 13.0 - 17.0 g/dL   HCT 04.5 40.9 - 81.1 %   MCV 90.0 78.0 - 100.0 fL   MCH 30.4 26.0 - 34.0 pg   MCHC 33.8 30.0 - 36.0 g/dL   RDW 91.4 78.2 -  95.6 %   Platelets 254 150 - 400 K/uL  I-stat troponin, ED  Result Value Ref Range   Troponin i, poc 0.02 0.00 - 0.08 ng/mL   Comment 3          I-stat troponin, ED  Result Value Ref Range   Troponin i, poc 0.00 0.00 - 0.08 ng/mL   Comment 3            EKG EKG Interpretation  Date/Time:  Friday Apr 05 2018 14:50:54 EDT Ventricular Rate:  76 PR Interval:  148 QRS Duration: 98 QT Interval:  364 QTC Calculation: 409 R Axis:   91 Text Interpretation:  Normal sinus rhythm with sinus arrhythmia Rightward axis No previous tracing Confirmed by Cathren Laine ([id number]) on 04/05/2018 10:40:07 PM   Radiology Dg Chest 2 View  Result Date: 04/05/2018 CLINICAL DATA:  Right chest pain, shortness of breath and dizziness for the past 2 days. EXAM: CHEST - 2 VIEW COMPARISON:  None. FINDINGS: The heart size and mediastinal contours are within normal limits. Both lungs are clear. The visualized skeletal structures are unremarkable. IMPRESSION: Normal examination. Electronically Signed   By: Beckie Salts M.D.   On: 04/05/2018 15:28    Procedures Procedures (including critical care time)  Medications Ordered in ED Medications - No data to display   Initial Impression / Assessment and Plan / ED Course  I have reviewed the triage vital signs and the nursing notes.  Pertinent labs & imaging results that were available during my care of the patient were reviewed by me and considered in my medical decision making (see chart for details).     Patient here for evaluation of 4 days of chest pain. No fever, SoB, Pleuritic pain, nausea or URI symptoms.   Labs, including troponin x 2, are negative, CXR clear. VSS. Do not feel it is pulmonary or cardiac. Likely chest wall discomfort. He is felt appropriate for discharge.   Final Clinical Impressions(s) / ED Diagnoses   Final diagnoses:  Nonspecific chest pain   1. Nonspecific chest pain  ED Discharge Orders    None       Elpidio Anis,  PA-C 04/05/18 2243    Elpidio Anis, PA-C 04/05/18 2245    Cathren Laine, MD 04/05/18 2251

## 2018-04-05 NOTE — Discharge Instructions (Addendum)
Ibuprofen for any discomfort. If pain continues, follow up with a primary care provider or, if symptoms worsen, return here for further evaluation.

## 2018-04-05 NOTE — ED Notes (Signed)
Patient left at this time with all belongings. 

## 2018-04-05 NOTE — ED Notes (Signed)
Pharmacy tech at the bedside 

## 2018-04-05 NOTE — ED Triage Notes (Signed)
Patient complains of CP and right lateral rib pain x 1 week, pain worse at times with movement, denies recent illness, NAD

## 2018-09-13 ENCOUNTER — Other Ambulatory Visit: Payer: Self-pay

## 2018-09-13 ENCOUNTER — Emergency Department
Admission: EM | Admit: 2018-09-13 | Discharge: 2018-09-13 | Disposition: A | Discharge status: Home / Self Care | Payer: Self-pay | Attending: Emergency Medicine | Admitting: Emergency Medicine

## 2018-09-13 DIAGNOSIS — R369 Urethral discharge, unspecified: Secondary | ICD-10-CM | POA: Insufficient documentation

## 2018-09-13 DIAGNOSIS — F17210 Nicotine dependence, cigarettes, uncomplicated: Secondary | ICD-10-CM | POA: Insufficient documentation

## 2018-09-13 DIAGNOSIS — Z202 Contact with and (suspected) exposure to infections with a predominantly sexual mode of transmission: Secondary | ICD-10-CM | POA: Insufficient documentation

## 2018-09-13 LAB — URINALYSIS, COMPLETE (UACMP) WITH MICROSCOPIC
BACTERIA UA: NONE SEEN
BILIRUBIN URINE: NEGATIVE
GLUCOSE, UA: NEGATIVE mg/dL
Hgb urine dipstick: NEGATIVE
KETONES UR: NEGATIVE mg/dL
LEUKOCYTES UA: NEGATIVE
NITRITE: NEGATIVE
PH: 6 (ref 5.0–8.0)
PROTEIN: NEGATIVE mg/dL
Specific Gravity, Urine: 1.011 (ref 1.005–1.030)
WBC, UA: NONE SEEN WBC/hpf (ref 0–5)

## 2018-09-13 NOTE — ED Notes (Signed)
Pt states he has an STD. Pt states his stomach hurts and one of his balls hurts. Pt denies any burning. Pt state he does have some discharge present.

## 2018-09-13 NOTE — ED Triage Notes (Signed)
Pt states he is having penile discharge and his partner is in the hospital due to complications of gonorrhea

## 2018-09-13 NOTE — ED Provider Notes (Signed)
[facility] Emergency Department Provider Note   ____________________________________________   First MD Initiated Contact with Patient 09/13/18 1148     (approximate)  I have reviewed the triage vital signs and the nursing notes.   HISTORY  Chief Complaint Exposure to STD    HPI SRIMAN TALLY is a 22 y.o. male patient presented urethral discharge for 2 to 3 days.  States sexual partner has been admitted to hospital secondary to complications of gonorrhea.  Patient state mild discomfort with voiding.  Patient denies any lesions.  History reviewed. No pertinent past medical history.  There are no active problems to display for this patient.   Past Surgical History:  Procedure Laterality Date  . APPENDECTOMY      Prior to Admission medications   Medication Sig Start Date End Date Taking? Authorizing Provider  ondansetron (ZOFRAN ODT) 4 MG disintegrating tablet Take 1 tablet (4 mg total) by mouth every 8 (eight) hours as needed for nausea or vomiting. Patient not taking: Reported on 04/05/2018 05/16/16   Rebecka Apley, MD  ranitidine (ZANTAC) 150 MG tablet Take 150 mg by mouth daily as needed for heartburn.    [provider]    Allergies Patient has no known allergies.  No family history on file.  Social History Social History   Tobacco Use  . Smoking status: Current Every Day Smoker    Packs/day: 0.50    Types: Cigarettes  . Smokeless tobacco: Never Used  Substance Use Topics  . Alcohol use: No  . Drug use: No    Review of Systems Constitutional: No fever/chills Eyes: No visual changes. ENT: No sore throat. Cardiovascular: Denies chest pain. Respiratory: Denies shortness of breath. Gastrointestinal: No abdominal pain.  No nausea, no vomiting.  No diarrhea.  No constipation. Genitourinary: Dysuria and urethral discharge. Musculoskeletal: Negative for back pain. Skin: Negative for rash. Neurological:  Negative for headaches, focal weakness or numbness.   ____________________________________________   PHYSICAL EXAM:  VITAL SIGNS: ED Triage Vitals  Enc Vitals Group     BP 09/13/18 1137 (!) 145/95     Pulse Rate 09/13/18 1137 66     Resp 09/13/18 1137 16     Temp 09/13/18 1137 98.5 F (36.9 C)     Temp Source 09/13/18 1137 Oral     SpO2 09/13/18 1137 100 %     Weight --      Height 09/13/18 1138 5\' 9"  (1.753 m)     Head Circumference --      Peak Flow --      Pain Score 09/13/18 1138 0     Pain Loc --      Pain Edu? --      Excl. in GC? --    Constitutional: Alert and oriented. Well appearing and in no acute distress. Cardiovascular: Normal rate, regular rhythm. Grossly normal heart sounds.  Good peripheral circulation. Respiratory: Normal respiratory effort.  No retractions. Lungs CTAB. Genitourinary: No obvious urethral discharge at this time.  No penile lesions.  No inguinal adenopathy. Musculoskeletal: No lower extremity tenderness nor edema.  No joint effusions. Neurologic:  Normal speech and language. No gross focal neurologic deficits are appreciated. No gait instability. Skin:  Skin is warm, dry and intact. No rash noted. Psychiatric: Mood and affect are normal. Speech and behavior are normal.  ____________________________________________   LABS (all labs ordered are listed, but only abnormal results are displayed)  Labs Reviewed  URINALYSIS, COMPLETE (UACMP) WITH MICROSCOPIC - Abnormal;  Notable for the following components:      Result Value   Color, Urine STRAW (*)    APPearance CLEAR (*)    All other components within normal limits   ____________________________________________  EKG   ____________________________________________  RADIOLOGY  ED MD interpretation:    Official radiology report(s): No results found.  ____________________________________________   PROCEDURES  Procedure(s) performed: None  Procedures  Critical Care  performed: No  ____________________________________________   INITIAL IMPRESSION / ASSESSMENT AND PLAN / ED COURSE  As part of my medical decision making, I reviewed the following data within the electronic MEDICAL RECORD NUMBER    Patient presents with possible STD exposure.  Patient physical exam was unremarkable.  Patient urinalysis was negative.  The lab state the patient's chlamydia gonorrhea cannot be run due to air and collection.  Patient elected to follow-up with [facility] department.      ____________________________________________   FINAL CLINICAL IMPRESSION(S) / ED DIAGNOSES  Final diagnoses:  STD exposure     ED Discharge Orders    None       Note:  This document was prepared using Dragon voice recognition software and may include unintentional dictation errors.    Joni Reining, PA-C 09/13/18 1231    Jene Every, MD 09/13/18 1435

## 2018-09-13 NOTE — Discharge Instructions (Addendum)
Your urinalysis was negative for bacteria or white blood cells.  Advised to follow-up with [facility] department for definitive evaluation and treatment.

## 2019-05-13 ENCOUNTER — Other Ambulatory Visit: Payer: Self-pay

## 2019-05-13 ENCOUNTER — Encounter: Payer: Self-pay | Admitting: Emergency Medicine

## 2019-05-13 ENCOUNTER — Emergency Department
Admission: EM | Admit: 2019-05-13 | Discharge: 2019-05-13 | Disposition: A | Discharge status: Home / Self Care | Payer: Self-pay | Attending: Emergency Medicine | Admitting: Emergency Medicine

## 2019-05-13 DIAGNOSIS — Z202 Contact with and (suspected) exposure to infections with a predominantly sexual mode of transmission: Secondary | ICD-10-CM

## 2019-05-13 DIAGNOSIS — A5401 Gonococcal cystitis and urethritis, unspecified: Secondary | ICD-10-CM

## 2019-05-13 DIAGNOSIS — F17210 Nicotine dependence, cigarettes, uncomplicated: Secondary | ICD-10-CM | POA: Insufficient documentation

## 2019-05-13 LAB — URINALYSIS, COMPLETE (UACMP) WITH MICROSCOPIC
Bacteria, UA: NONE SEEN
Bilirubin Urine: NEGATIVE
Glucose, UA: NEGATIVE mg/dL
Hgb urine dipstick: NEGATIVE
Ketones, ur: NEGATIVE mg/dL
Nitrite: NEGATIVE
Protein, ur: NEGATIVE mg/dL
Specific Gravity, Urine: 1.027 (ref 1.005–1.030)
WBC, UA: >50 WBC/hpf — ABNORMAL HIGH (ref 0–5)
pH: 6 (ref 5.0–8.0)

## 2019-05-13 LAB — CHLAMYDIA/NGC RT PCR (ARMC ONLY)
Chlamydia Tr: NOT DETECTED
N gonorrhoeae: DETECTED — AB

## 2019-05-13 MED ORDER — CEFTRIAXONE SODIUM 250 MG IJ SOLR
250.0000 mg | Freq: Once | INTRAMUSCULAR | Status: AC
  Administered 2019-05-13: 250 mg via INTRAMUSCULAR
  Filled 2019-05-13: qty 250

## 2019-05-13 MED ORDER — AZITHROMYCIN 500 MG PO TABS
1000.0000 mg | ORAL_TABLET | Freq: Once | ORAL | Status: AC
  Administered 2019-05-13: 21:00:00 1000 mg via ORAL
  Filled 2019-05-13: qty 2

## 2019-05-13 NOTE — Discharge Instructions (Signed)
You have been treated for gonorrhea and chlamydia in the ED. You should refrain from any sexual contact for 1 week. Follow-up with your provider for ongoing symptoms.

## 2019-05-13 NOTE — ED Triage Notes (Signed)
Patient presents to the ED with urinary frequency and dysuria x 2 days with unusual penile discharge this am.  Patient reports having unprotected sex recently.  Patient denies history of similar symptoms.

## 2019-05-13 NOTE — ED Provider Notes (Signed)
[facility] Emergency Department Provider Note ____________________________________________  Time seen: [id number]  I have reviewed the triage vital signs and the nursing notes.  HISTORY  Chief Complaint  Dysuria and Penile Discharge  HPI Steven Deleon is a 23 y.o. male presents himself to the ED for concern of STD exposure.  Patient reports an unprotected sexual encounter last week.  He began to experience penile discharge and intermittent dysuria today.  He denies any fevers, chills, nausea, or abdominal pain.  He also denies any penile lesions, palmar rash, or swollen lymph nodes.  History reviewed. No pertinent past medical history.  There are no active problems to display for this patient.   Past Surgical History:  Procedure Laterality Date  . APPENDECTOMY      Prior to Admission medications   Medication Sig Start Date End Date Taking? Authorizing Provider  ondansetron (ZOFRAN ODT) 4 MG disintegrating tablet Take 1 tablet (4 mg total) by mouth every 8 (eight) hours as needed for nausea or vomiting. Patient not taking: Reported on 04/05/2018 05/16/16   Rebecka ApleyWebster, Allison P, MD  ranitidine (ZANTAC) 150 MG tablet Take 150 mg by mouth daily as needed for heartburn.    [provider]    Allergies Patient has no known allergies.  No family history on file.  Social History Social History   Tobacco Use  . Smoking status: Current Every Day Smoker    Packs/day: 0.50    Types: Cigarettes  . Smokeless tobacco: Never Used  Substance Use Topics  . Alcohol use: No  . Drug use: No    Review of Systems  Constitutional: Negative for fever. Eyes: Negative for visual changes. ENT: Negative for sore throat. Cardiovascular: Negative for chest pain. Respiratory: Negative for shortness of breath. Gastrointestinal: Negative for abdominal pain, vomiting and diarrhea. Genitourinary: Positive for dysuria and penile discharge. Musculoskeletal:  Negative for back pain. Skin: Negative for rash. Neurological: Negative for headaches, focal weakness or numbness. ____________________________________________  PHYSICAL EXAM:  VITAL SIGNS: ED Triage Vitals [05/13/19 1840]  Enc Vitals Group     BP (!) 150/88     Pulse Rate 72     Resp 16     Temp 99 F (37.2 C)     Temp Source Oral     SpO2 99 %     Weight 178 lb (80.7 kg)     Height 5\' 9"  (1.753 m)     Head Circumference      Peak Flow      Pain Score 0     Pain Loc      Pain Edu?      Excl. in GC?     Constitutional: Alert and oriented. Well appearing and in no distress. Head: Normocephalic and atraumatic. Eyes: Conjunctivae are normal. Normal extraocular movements Cardiovascular: Normal rate, regular rhythm. Normal distal pulses. Respiratory: Normal respiratory effort. No wheezes/rales/rhonchi. Gastrointestinal: Soft and nontender. No distention. GU: Deferred Musculoskeletal: Nontender with normal range of motion in all extremities.  Neurologic:  Normal gait without ataxia. Normal speech and language. No gross focal neurologic deficits are appreciated. Skin:  Skin is warm, dry and intact. No rash noted. Psychiatric: Mood and affect are normal. Patient exhibits appropriate insight and judgment. ____________________________________________   LABS (pertinent positives/negatives) Labs Reviewed  CHLAMYDIA/NGC RT PCR ([facility] ONLY) - Abnormal; Notable for the following components:      Result Value   N gonorrhoeae DETECTED (*)    All other components within normal limits  URINALYSIS, COMPLETE (  UACMP) WITH MICROSCOPIC - Abnormal; Notable for the following components:   Color, Urine YELLOW (*)    APPearance HAZY (*)    Leukocytes,Ua MODERATE (*)    WBC, UA >50 (*)    All other components within normal limits  ____________________________________________  PROCEDURES  Procedures Rocephin 250 mg PO Azithromycin 1 g  PO ____________________________________________  INITIAL IMPRESSION / ASSESSMENT AND PLAN / ED COURSE  JERMANI EBERLEIN was evaluated in Emergency Department on 05/13/2019 for the symptoms described in the history of present illness. He was evaluated in the context of the global COVID-19 pandemic, which necessitated consideration that the patient might be at risk for infection with the SARS-CoV-2 virus that causes COVID-19. Institutional protocols and algorithms that pertain to the evaluation of patients at risk for COVID-19 are in a state of rapid change based on information released by regulatory bodies including the CDC and federal and state organizations. These policies and algorithms were followed during the patient's care in the ED.  Patient with a ED evaluation of penile discharge after unprotected sexual encounter.  Patient was treated empirically as his GC culture is pending at this time.  Patient is discharged after antibiotic administration.  He will follow with primary provider as needed.  He is advised to refrain from sexual encounter for 1 week.  Return precautions have been reviewed. ____________________________________________  FINAL CLINICAL IMPRESSION(S) / ED DIAGNOSES  Final diagnoses:  Acute gonococcal urethritis  Exposure to STD      Ashlen Kiger, Dannielle Karvonen, PA-C 05/13/19 2108    Nena Polio, MD 05/13/19 [phone]

## 2019-06-26 ENCOUNTER — Emergency Department
Admission: EM | Admit: 2019-06-26 | Discharge: 2019-06-26 | Disposition: A | Discharge status: Home / Self Care | Payer: Self-pay | Attending: Emergency Medicine | Admitting: Emergency Medicine

## 2019-06-26 ENCOUNTER — Other Ambulatory Visit: Payer: Self-pay

## 2019-06-26 ENCOUNTER — Encounter: Payer: Self-pay | Admitting: Emergency Medicine

## 2019-06-26 DIAGNOSIS — F17210 Nicotine dependence, cigarettes, uncomplicated: Secondary | ICD-10-CM | POA: Insufficient documentation

## 2019-06-26 DIAGNOSIS — R1030 Lower abdominal pain, unspecified: Secondary | ICD-10-CM | POA: Insufficient documentation

## 2019-06-26 DIAGNOSIS — Z202 Contact with and (suspected) exposure to infections with a predominantly sexual mode of transmission: Secondary | ICD-10-CM | POA: Insufficient documentation

## 2019-06-26 DIAGNOSIS — K5900 Constipation, unspecified: Secondary | ICD-10-CM | POA: Insufficient documentation

## 2019-06-26 DIAGNOSIS — M545 Low back pain: Secondary | ICD-10-CM | POA: Insufficient documentation

## 2019-06-26 LAB — URINALYSIS, COMPLETE (UACMP) WITH MICROSCOPIC
Bacteria, UA: NONE SEEN
Bilirubin Urine: NEGATIVE
Glucose, UA: NEGATIVE mg/dL
Hgb urine dipstick: NEGATIVE
Ketones, ur: NEGATIVE mg/dL
Leukocytes,Ua: NEGATIVE
Nitrite: NEGATIVE
Protein, ur: NEGATIVE mg/dL
Specific Gravity, Urine: 1.02 (ref 1.005–1.030)
pH: 5 (ref 5.0–8.0)

## 2019-06-26 MED ORDER — LIDOCAINE HCL (PF) 1 % IJ SOLN
0.9000 mL | Freq: Once | INTRAMUSCULAR | Status: AC
  Administered 2019-06-26: 0.9 mL

## 2019-06-26 MED ORDER — CEFTRIAXONE SODIUM 250 MG IJ SOLR
250.0000 mg | Freq: Once | INTRAMUSCULAR | Status: AC
  Administered 2019-06-26: 250 mg via INTRAMUSCULAR
  Filled 2019-06-26: qty 250

## 2019-06-26 MED ORDER — AZITHROMYCIN 500 MG PO TABS
1000.0000 mg | ORAL_TABLET | Freq: Once | ORAL | Status: AC
  Administered 2019-06-26: 1000 mg via ORAL
  Filled 2019-06-26: qty 2

## 2019-06-26 MED ORDER — LIDOCAINE HCL (PF) 1 % IJ SOLN
INTRAMUSCULAR | Status: AC
  Administered 2019-06-26: 0.9 mL
  Filled 2019-06-26: qty 5

## 2019-06-26 MED ORDER — METRONIDAZOLE 500 MG PO TABS
2000.0000 mg | ORAL_TABLET | Freq: Once | ORAL | Status: AC
  Administered 2019-06-26: 2000 mg via ORAL
  Filled 2019-06-26: qty 4

## 2019-06-26 NOTE — ED Provider Notes (Signed)
[facility] Emergency Department Provider Note  ____________________________________________  Time seen: Approximately 4:50 PM  I have reviewed the triage vital signs and the nursing notes.   HISTORY  Chief Complaint SEXUALLY TRANSMITTED DISEASE    HPI Steven Deleon is a 23 y.o. male presents to the emergency department with low back pain after patient states that he had an episode of unprotected sex.  Patient reports that his partner told him that she had "some kind of STD".  Patient states that he has had STDs in the past and reports that symptoms currently feel similar.  He denies prior urinary tract infections not associated with STDs.  He denies nausea, vomiting, fever or chills at home.  Patient denies penile discharge or dysuria.        History reviewed. No pertinent past medical history.  There are no active problems to display for this patient.   Past Surgical History:  Procedure Laterality Date  . APPENDECTOMY      Prior to Admission medications   Medication Sig Start Date End Date Taking? Authorizing Provider  ondansetron (ZOFRAN ODT) 4 MG disintegrating tablet Take 1 tablet (4 mg total) by mouth every 8 (eight) hours as needed for nausea or vomiting. Patient not taking: Reported on 04/05/2018 05/16/16   Rebecka ApleyWebster, Allison P, MD  ranitidine (ZANTAC) 150 MG tablet Take 150 mg by mouth daily as needed for heartburn.    [provider]    Allergies Patient has no known allergies.  No family history on file.  Social History Social History   Tobacco Use  . Smoking status: Current Every Day Smoker    Packs/day: 0.50    Types: Cigarettes  . Smokeless tobacco: Never Used  Substance Use Topics  . Alcohol use: No  . Drug use: No     Review of Systems  Constitutional: No fever/chills Eyes: No visual changes. No discharge ENT: No upper respiratory complaints. Cardiovascular: no chest pain. Respiratory: no cough. No  SOB. Gastrointestinal: No abdominal pain.  No nausea, no vomiting.  No diarrhea.  No constipation.  Patient has low back pain. Genitourinary: Negative for dysuria. No hematuria Musculoskeletal: Negative for musculoskeletal pain. Skin: Negative for rash, abrasions, lacerations, ecchymosis. Neurological: Negative for headaches, focal weakness or numbness.  ____________________________________________   PHYSICAL EXAM:  VITAL SIGNS: ED Triage Vitals  Enc Vitals Group     BP 06/26/19 1517 133/75     Pulse Rate 06/26/19 1517 89     Resp 06/26/19 1517 16     Temp 06/26/19 1517 99 F (37.2 C)     Temp Source 06/26/19 1517 Oral     SpO2 06/26/19 1517 95 %     Weight 06/26/19 1514 170 lb (77.1 kg)     Height 06/26/19 1514 5\' 10"  (1.778 m)     Head Circumference --      Peak Flow --      Pain Score 06/26/19 1513 3     Pain Loc --      Pain Edu? --      Excl. in GC? --      Constitutional: Alert and oriented. Well appearing and in no acute distress. Eyes: Conjunctivae are normal. PERRL. EOMI. Head: Atraumatic. Cardiovascular: Normal rate, regular rhythm. Normal S1 and S2.  Good peripheral circulation. Respiratory: Normal respiratory effort without tachypnea or retractions. Lungs CTAB. Good air entry to the bases with no decreased or absent breath sounds. Gastrointestinal: Bowel sounds 4 quadrants. Soft and nontender to palpation.  No guarding or rigidity. No palpable masses. No distention. No CVA tenderness.  No suprapubic tenderness to palpation. Musculoskeletal: Full range of motion to all extremities. No gross deformities appreciated.  Neurologic:  Normal speech and language. No gross focal neurologic deficits are appreciated.  Skin:  Skin is warm, dry and intact. No rash noted. Psychiatric: Mood and affect are normal. Speech and behavior are normal. Patient exhibits appropriate insight and judgement.   ____________________________________________   LABS (all labs ordered are  listed, but only abnormal results are displayed)  Labs Reviewed  URINALYSIS, COMPLETE (UACMP) WITH MICROSCOPIC - Abnormal; Notable for the following components:      Result Value   Color, Urine YELLOW (*)    APPearance HAZY (*)    All other components within normal limits  GC/CHLAMYDIA PROBE AMP   ____________________________________________  EKG   ____________________________________________  RADIOLOGY   No results found.  ____________________________________________    PROCEDURES  Procedure(s) performed:    Procedures    Medications  cefTRIAXone (ROCEPHIN) injection 250 mg (250 mg Intramuscular Given 06/26/19 1635)  azithromycin (ZITHROMAX) tablet 1,000 mg (1,000 mg Oral Given 06/26/19 1632)  metroNIDAZOLE (FLAGYL) tablet 2,000 mg (2,000 mg Oral Given 06/26/19 1632)  lidocaine (PF) (XYLOCAINE) 1 % injection 0.9 mL (0.9 mLs Other Given 06/26/19 1633)     ____________________________________________   INITIAL IMPRESSION / ASSESSMENT AND PLAN / ED COURSE  Pertinent labs & imaging results that were available during my care of the patient were reviewed by me and considered in my medical decision making (see chart for details).  Review of the [place] CSRS was performed in accordance of the [place] prior to dispensing any controlled drugs.          Assessment and Plan:  STD exposure 24 year old male presents to the emergency department with concern for STD exposure.  Patient reports that his only symptom has been some mild low back pain.  Patient had no CVA tenderness or suprapubic pain  Differential diagnosis includes STD, cystitis and low back pain...  Urinalysis was noncontributory for cystitis.  Patient was treated for gonorrhea, chlamydia and trichomoniasis in the emergency department with azithromycin, Rocephin and Flagyl.  He was advised to follow-up with local health department in 2 weeks for repeat testing.  All patient questions were  answered.   ____________________________________________  FINAL CLINICAL IMPRESSION(S) / ED DIAGNOSES  Final diagnoses:  STD exposure      NEW MEDICATIONS STARTED DURING THIS VISIT:  ED Discharge Orders    None          This chart was dictated using voice recognition software/Dragon. Despite best efforts to proofread, errors can occur which can change the meaning. Any change was purely unintentional.    Lannie Fields, PA-C 06/26/19 1737    Vanessa , MD 06/27/19 [phone]

## 2019-06-26 NOTE — ED Triage Notes (Signed)
Pt reports needs to be checked for STD's.

## 2019-06-26 NOTE — ED Notes (Signed)
PT states pain around groin and feeling constipated. PT able to provide urine sample. Eating and drinking okay. No acute distress noted.

## 2019-06-30 LAB — GC/CHLAMYDIA PROBE AMP
Chlamydia trachomatis, NAA: NEGATIVE
Neisseria Gonorrhoeae by PCR: NEGATIVE

## 2019-10-01 ENCOUNTER — Emergency Department (HOSPITAL_COMMUNITY): Payer: Self-pay

## 2019-10-01 ENCOUNTER — Other Ambulatory Visit: Payer: Self-pay

## 2019-10-01 ENCOUNTER — Emergency Department (HOSPITAL_COMMUNITY)
Admission: EM | Admit: 2019-10-01 | Discharge: 2019-10-01 | Disposition: A | Discharge status: Home / Self Care | Payer: Self-pay | Attending: Emergency Medicine | Admitting: Emergency Medicine

## 2019-10-01 ENCOUNTER — Encounter (HOSPITAL_COMMUNITY): Payer: Self-pay | Admitting: Emergency Medicine

## 2019-10-01 DIAGNOSIS — J029 Acute pharyngitis, unspecified: Secondary | ICD-10-CM | POA: Insufficient documentation

## 2019-10-01 DIAGNOSIS — J069 Acute upper respiratory infection, unspecified: Secondary | ICD-10-CM | POA: Insufficient documentation

## 2019-10-01 DIAGNOSIS — Z20828 Contact with and (suspected) exposure to other viral communicable diseases: Secondary | ICD-10-CM | POA: Insufficient documentation

## 2019-10-01 DIAGNOSIS — F17210 Nicotine dependence, cigarettes, uncomplicated: Secondary | ICD-10-CM | POA: Insufficient documentation

## 2019-10-01 DIAGNOSIS — R062 Wheezing: Secondary | ICD-10-CM | POA: Insufficient documentation

## 2019-10-01 DIAGNOSIS — M7918 Myalgia, other site: Secondary | ICD-10-CM | POA: Insufficient documentation

## 2019-10-01 DIAGNOSIS — R0981 Nasal congestion: Secondary | ICD-10-CM | POA: Insufficient documentation

## 2019-10-01 LAB — POC SARS CORONAVIRUS 2 AG -  ED: SARS Coronavirus 2 Ag: NEGATIVE

## 2019-10-01 LAB — SARS CORONAVIRUS 2 (TAT 6-24 HRS): SARS Coronavirus 2: NEGATIVE

## 2019-10-01 MED ORDER — PREDNISONE 20 MG PO TABS
60.0000 mg | ORAL_TABLET | Freq: Once | ORAL | Status: AC
  Administered 2019-10-01: 14:00:00 60 mg via ORAL
  Filled 2019-10-01: qty 3

## 2019-10-01 MED ORDER — ALBUTEROL SULFATE HFA 108 (90 BASE) MCG/ACT IN AERS
4.0000 | INHALATION_SPRAY | Freq: Once | RESPIRATORY_TRACT | Status: AC
  Administered 2019-10-01: 14:00:00 4 via RESPIRATORY_TRACT
  Filled 2019-10-01: qty 6.7

## 2019-10-01 MED ORDER — AEROCHAMBER PLUS FLO-VU LARGE MISC
1.0000 | Freq: Once | Status: AC
  Administered 2019-10-01: 1

## 2019-10-01 MED ORDER — NAPROXEN 250 MG PO TABS
500.0000 mg | ORAL_TABLET | Freq: Once | ORAL | Status: AC
  Administered 2019-10-01: 14:00:00 500 mg via ORAL
  Filled 2019-10-01: qty 2

## 2019-10-01 MED ORDER — IBUPROFEN 600 MG PO TABS: 600.0000 mg | ORAL_TABLET | Freq: Four times a day (QID) | ORAL | 0 refills | Status: DC | PRN

## 2019-10-01 MED ORDER — AEROCHAMBER PLUS FLO-VU LARGE MISC
Status: AC
  Filled 2019-10-01: qty 1

## 2019-10-01 MED ORDER — METHOCARBAMOL 500 MG PO TABS
500.0000 mg | ORAL_TABLET | Freq: Once | ORAL | Status: AC
  Administered 2019-10-01: 14:00:00 500 mg via ORAL
  Filled 2019-10-01: qty 1

## 2019-10-01 MED ORDER — PREDNISONE 20 MG PO TABS: 60.0000 mg | ORAL_TABLET | Freq: Every day | ORAL | 0 refills | Status: AC

## 2019-10-01 MED ORDER — METHOCARBAMOL 500 MG PO TABS: 500.0000 mg | ORAL_TABLET | Freq: Two times a day (BID) | ORAL | 0 refills | Status: DC

## 2019-10-01 NOTE — ED Notes (Signed)
Pt vomited all his meds up.

## 2019-10-01 NOTE — ED Notes (Signed)
Patient verbalizes understanding of discharge instructions. Opportunity for questioning and answers were provided. Armband removed by staff, pt discharged from ED.  

## 2019-10-01 NOTE — ED Triage Notes (Signed)
Pt arrives to ED from home with complaints of cough, congestion, and lower body aches.

## 2019-10-01 NOTE — ED Provider Notes (Signed)
[facility] EMERGENCY DEPARTMENT Provider Note   CSN: [id number] Arrival date & time: 10/01/19  1243     History   Chief Complaint Chief Complaint  Patient presents with  . Cough    HPI Steven Deleon is a 23 y.o. male.     Steven Deleon is a 23 y.o. male who is otherwise healthy, presents to the ED for evaluation of cough, congestion, body aches and back pain.  He reports symptoms began yesterday.  He reports symptoms started with runny nose and frequent cough.  He reports associated chills but no fevers.  He reports that he has some chest soreness when coughing but no persistent chest pain.  He has noted some wheezing, has history of asthma but usually only becomes an issue when he is sick, he does not have an inhaler at home.  He reports that he had one episode of emesis after coughing this morning, but denies abdominal pain or diarrhea.  He denies any known sick contacts.  Reports he took some TheraFlu for his symptoms without improvement.  He reports he is having myalgias throughout his back, he reports pain is not localized to one area but is an achy pain everywhere that is worse with movement.  Denies any numbness or tingling.  Denies any dysuria or urinary frequency.  No other aggravating or alleviating factors.     History reviewed. No pertinent past medical history.  There are no active problems to display for this patient.   Past Surgical History:  Procedure Laterality Date  . APPENDECTOMY          Home Medications    Prior to Admission medications   Medication Sig Start Date End Date Taking? Authorizing Provider  ibuprofen (ADVIL) 600 MG tablet Take 1 tablet (600 mg total) by mouth every 6 (six) hours as needed. 10/01/19   Jacqlyn Larsen, PA-C  methocarbamol (ROBAXIN) 500 MG tablet Take 1 tablet (500 mg total) by mouth 2 (two) times daily. 10/01/19   Jacqlyn Larsen, PA-C  ondansetron (ZOFRAN ODT) 4 MG disintegrating tablet  Take 1 tablet (4 mg total) by mouth every 8 (eight) hours as needed for nausea or vomiting. Patient not taking: Reported on 04/05/2018 05/16/16   Loney Hering, MD  predniSONE (DELTASONE) 20 MG tablet Take 3 tablets (60 mg total) by mouth daily for 5 days. 10/01/19 10/06/19  Jacqlyn Larsen, PA-C  ranitidine (ZANTAC) 150 MG tablet Take 150 mg by mouth daily as needed for heartburn.    [provider]    Family History History reviewed. No pertinent family history.  Social History Social History   Tobacco Use  . Smoking status: Current Every Day Smoker    Packs/day: 0.50    Types: Cigarettes  . Smokeless tobacco: Never Used  Substance Use Topics  . Alcohol use: No  . Drug use: No     Allergies   Patient has no known allergies.   Review of Systems Review of Systems  Constitutional: Positive for chills. Negative for fever.  HENT: Positive for congestion, rhinorrhea and sore throat.   Respiratory: Positive for cough and wheezing. Negative for shortness of breath.   Cardiovascular: Negative for chest pain.  Gastrointestinal: Positive for nausea and vomiting. Negative for abdominal pain and diarrhea.  Genitourinary: Negative for dysuria.  Musculoskeletal: Positive for back pain and myalgias.  Skin: Negative for color change and rash.  Neurological: Negative for dizziness, syncope, light-headedness and headaches.  Physical Exam Updated Vital Signs BP (!) 176/92 (BP Location: Right Arm)   Pulse 94   Temp 98 F (36.7 C) (Oral)   Resp 16   SpO2 98%   Physical Exam Vitals signs and nursing note reviewed.  Constitutional:      General: He is not in acute distress.    Appearance: Normal appearance. He is well-developed and normal weight. He is not ill-appearing or diaphoretic.     Comments: Well-appearing and in no distress  HENT:     Head: Normocephalic and atraumatic.     Nose: Congestion and rhinorrhea present.     Mouth/Throat:     Mouth: Mucous  membranes are moist.     Pharynx: Oropharynx is clear. No oropharyngeal exudate or posterior oropharyngeal erythema.  Eyes:     General:        Right eye: No discharge.        Left eye: No discharge.  Neck:     Musculoskeletal: Neck supple.     Comments: No rigidity Cardiovascular:     Rate and Rhythm: Normal rate and regular rhythm.     Heart sounds: Normal heart sounds. No murmur. No friction rub. No gallop.   Pulmonary:     Effort: Pulmonary effort is normal. No respiratory distress.     Breath sounds: Wheezing present.     Comments: Respirations equal and unlabored, no respiratory distress, on auscultation patient has some faint scattered wheezes throughout, but lungs otherwise clear Abdominal:     General: Bowel sounds are normal. There is no distension.     Palpations: Abdomen is soft. There is no mass.     Tenderness: There is no abdominal tenderness. There is no guarding.     Comments: Abdomen soft, nondistended, nontender to palpation in all quadrants without guarding or peritoneal signs, no CVA tenderness bilaterally  Musculoskeletal:        General: No deformity.     Comments: Myalgias endorsed throughout the upper and lower back bilaterally, no focal midline tenderness.  Lymphadenopathy:     Cervical: No cervical adenopathy.  Skin:    General: Skin is warm and dry.     Capillary Refill: Capillary refill takes less than 2 seconds.  Neurological:     Mental Status: He is alert and oriented to person, place, and time.     Coordination: Coordination normal.  Psychiatric:        Mood and Affect: Mood normal.        Behavior: Behavior normal.      ED Treatments / Results  Labs (all labs ordered are listed, but only abnormal results are displayed) Labs Reviewed  SARS CORONAVIRUS 2 (TAT 6-24 HRS)  POC SARS CORONAVIRUS 2 AG -  ED    EKG None  Radiology Dg Chest Portable 1 View  Result Date: 10/01/2019 CLINICAL DATA:  Cough, congestion, lower body aches EXAM:  PORTABLE CHEST 1 VIEW COMPARISON:  04/05/2018 FINDINGS: Lungs are clear.  No pleural effusion or pneumothorax. The heart is normal in size. IMPRESSION: No evidence of acute cardiopulmonary disease. Electronically Signed   By: Charline BillsSriyesh  Krishnan M.D.   On: 10/01/2019 13:36    Procedures Procedures (including critical care time)  Medications Ordered in ED Medications  AeroChamber Plus Flo-Vu Large MISC (has no administration in time range)  albuterol (VENTOLIN HFA) 108 (90 Base) MCG/ACT inhaler 4 puff (4 puffs Inhalation Given 10/01/19 1334)  AeroChamber Plus Flo-Vu Large MISC 1 each (1 each Other Given 10/01/19 1334)  predniSONE (DELTASONE) tablet 60 mg (60 mg Oral Given 10/01/19 1334)  naproxen (NAPROSYN) tablet 500 mg (500 mg Oral Given 10/01/19 1334)  methocarbamol (ROBAXIN) tablet 500 mg (500 mg Oral Given 10/01/19 1334)     Initial Impression / Assessment and Plan / ED Course  I have reviewed the triage vital signs and the nursing notes.  Pertinent labs & imaging results that were available during my care of the patient were reviewed by me and considered in my medical decision making (see chart for details).  Pt presents with chills, body aches, nasal congestion and cough. Pt is well appearing and vitals are normal. Lungs with some faint wheezing but otherwise clear.  Pt CXR negative for acute infiltrate. Patients symptoms are consistent with URI, likely viral etiology.  Rapid Covid test negative, reflexive PCR sent.  Discussed that antibiotics are not indicated for viral infections. Pt will be discharged with symptomatic treatment, as well as inhaler and short course of prednisone for wheezing.  Robaxin for back spasms related to frequent cough.  Verbalizes understanding and is agreeable with plan. Pt is hemodynamically stable & in NAD prior to dc. Return precautions discussed, pt expresses understanding and agrees with plan.   Steven Deleon was evaluated in Emergency Department  on 10/01/2019 for the symptoms described in the history of present illness. He was evaluated in the context of the global COVID-19 pandemic, which necessitated consideration that the patient might be at risk for infection with the SARS-CoV-2 virus that causes COVID-19. Institutional protocols and algorithms that pertain to the evaluation of patients at risk for COVID-19 are in a state of rapid change based on information released by regulatory bodies including the CDC and federal and state organizations. These policies and algorithms were followed during the patient's care in the ED.  Final Clinical Impressions(s) / ED Diagnoses   Final diagnoses:  Viral URI with cough  Wheezing    ED Discharge Orders         Ordered    methocarbamol (ROBAXIN) 500 MG tablet  2 times daily     10/01/19 1425    predniSONE (DELTASONE) 20 MG tablet  Daily     10/01/19 1425    ibuprofen (ADVIL) 600 MG tablet  Every 6 hours PRN     10/01/19 1425           Jodi Geralds [place], [place] 10/01/19 1446    Jacalyn Lefevre, MD 10/01/19 1448

## 2019-10-01 NOTE — Discharge Instructions (Signed)
Your symptoms are likely caused by a viral upper respiratory infection, this could be a cold versus the flu or the Covid virus, you have a Covid test pending and should quarantine at home until you receive the results of your test.  If it is positive you will be called by phone if it is negative you can review your results online through my chart. Antibiotics are not helpful in treating viral infection, the virus should run its course in about 5-7 days. Please make sure you are drinking plenty of fluids.  Take prednisone for the next 5 days and use 2 puffs of your albuterol inhaler every 4 hours as needed for cough.  You can treat your symptoms supportively with tylenol/ibuprofen every 6 hours for fevers and pains, Zyrtec and Flonase to help with nasal congestion, and over the counter cough syrups and throat lozenges to help with cough. If your symptoms are not improving please follow up with you Primary doctor.   If you develop persistent fevers, shortness of breath or difficulty breathing, chest pain, severe headache and neck pain, persistent nausea and vomiting or other new or concerning symptoms return to the Emergency department.

## 2019-10-01 NOTE — ED Notes (Signed)
Pt has called out twice for Pain Medication

## 2019-10-08 ENCOUNTER — Encounter: Payer: Self-pay | Admitting: Emergency Medicine

## 2019-10-08 ENCOUNTER — Other Ambulatory Visit: Payer: Self-pay

## 2019-10-08 ENCOUNTER — Emergency Department
Admission: EM | Admit: 2019-10-08 | Discharge: 2019-10-08 | Disposition: A | Discharge status: Home / Self Care | Payer: Self-pay | Attending: Emergency Medicine | Admitting: Emergency Medicine

## 2019-10-08 DIAGNOSIS — R030 Elevated blood-pressure reading, without diagnosis of hypertension: Secondary | ICD-10-CM | POA: Insufficient documentation

## 2019-10-08 DIAGNOSIS — Z202 Contact with and (suspected) exposure to infections with a predominantly sexual mode of transmission: Secondary | ICD-10-CM | POA: Insufficient documentation

## 2019-10-08 DIAGNOSIS — F17210 Nicotine dependence, cigarettes, uncomplicated: Secondary | ICD-10-CM | POA: Insufficient documentation

## 2019-10-08 DIAGNOSIS — R369 Urethral discharge, unspecified: Secondary | ICD-10-CM | POA: Insufficient documentation

## 2019-10-08 LAB — URINALYSIS, COMPLETE (UACMP) WITH MICROSCOPIC
Bacteria, UA: NONE SEEN
Bilirubin Urine: NEGATIVE
Glucose, UA: NEGATIVE mg/dL
Hgb urine dipstick: NEGATIVE
Ketones, ur: NEGATIVE mg/dL
Nitrite: NEGATIVE
Protein, ur: NEGATIVE mg/dL
Specific Gravity, Urine: 1.019 (ref 1.005–1.030)
Squamous Epithelial / LPF: NONE SEEN (ref 0–5)
WBC, UA: >50 WBC/hpf — ABNORMAL HIGH (ref 0–5)
pH: 6 (ref 5.0–8.0)

## 2019-10-08 NOTE — ED Triage Notes (Signed)
Pt requesting STD treatment, symptoms x2 days , penile discharge

## 2019-10-08 NOTE — ED Provider Notes (Signed)
[facility] Emergency Department Provider Note   ____________________________________________   First MD Initiated Contact with Patient 10/08/19 [phone]     (approximate)  I have reviewed the triage vital signs and the nursing notes.   HISTORY  Chief Complaint SEXUALLY TRANSMITTED DISEASE    HPI Steven Deleon is a 23 y.o. male patient requests STD evaluation secondary to penile discharge for 2 days.  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  Patient denies fever chills associated with complaint.  Patient denies lesions or pain.         History reviewed. No pertinent past medical history.  There are no active problems to display for this patient.   Past Surgical History:  Procedure Laterality Date  . APPENDECTOMY      Prior to Admission medications   Medication Sig Start Date End Date Taking? Authorizing Provider  ibuprofen (ADVIL) 600 MG tablet Take 1 tablet (600 mg total) by mouth every 6 (six) hours as needed. 10/01/19   Dartha Lodge, PA-C  methocarbamol (ROBAXIN) 500 MG tablet Take 1 tablet (500 mg total) by mouth 2 (two) times daily. 10/01/19   Dartha Lodge, PA-C  ondansetron (ZOFRAN ODT) 4 MG disintegrating tablet Take 1 tablet (4 mg total) by mouth every 8 (eight) hours as needed for nausea or vomiting. Patient not taking: Reported on 04/05/2018 05/16/16   Rebecka Apley, MD  ranitidine (ZANTAC) 150 MG tablet Take 150 mg by mouth daily as needed for heartburn.    [provider]    Allergies Patient has no known allergies.  No family history on file.  Social History Social History   Tobacco Use  . Smoking status: Current Every Day Smoker    Packs/day: 0.50    Types: Cigarettes  . Smokeless tobacco: Never Used  Substance Use Topics  . Alcohol use: No  . Drug use: No    Review of Systems Constitutional: No fever/chills Eyes: No visual changes. ENT: No sore throat. Cardiovascular:  Denies chest pain. Respiratory: Denies shortness of breath. Gastrointestinal: No abdominal pain.  No nausea, no vomiting.  No diarrhea.  No constipation. Genitourinary: Negative for dysuria.  Urethral discharge Musculoskeletal: Negative for back pain. Skin: Negative for rash. Neurological: Negative for headaches, focal weakness or numbness.   ____________________________________________   PHYSICAL EXAM:  VITAL SIGNS: ED Triage Vitals  Enc Vitals Group     BP 10/08/19 0750 (!) 166/95     Pulse Rate 10/08/19 0750 70     Resp 10/08/19 0750 18     Temp 10/08/19 0750 98.7 F (37.1 C)     Temp Source 10/08/19 0750 Oral     SpO2 10/08/19 0750 100 %     Weight 10/08/19 0751 170 lb (77.1 kg)     Height 10/08/19 0751 5\' 10"  (1.778 m)     Head Circumference --      Peak Flow --      Pain Score 10/08/19 0751 0     Pain Loc --      Pain Edu? --      Excl. in [place]? --    Constitutional: Alert and oriented. Well appearing and in no acute distress. Cardiovascular: Normal rate, regular rhythm. Grossly normal heart sounds.  Good peripheral circulation.  Elevated blood pressure Respiratory: Normal respiratory effort.  No retractions. Lungs CTAB. Gastrointestinal: Soft and nontender. No distention. No abdominal bruits. No CVA tenderness. Genitourinary: penile discharge. Skin:  Skin is warm, dry and  intact. No rash noted. Psychiatric: Mood and affect are normal. Speech and behavior are normal.  ____________________________________________   LABS (all labs ordered are listed, but only abnormal results are displayed)  Labs Reviewed  GC/CHLAMYDIA PROBE AMP  URINALYSIS, COMPLETE (UACMP) WITH MICROSCOPIC   ____________________________________________  EKG   ____________________________________________  RADIOLOGY  ED MD interpretation:    Official radiology report(s): No results found.  ____________________________________________   PROCEDURES  Procedure(s) performed  (including Critical Care):  Procedures   ____________________________________________   INITIAL IMPRESSION / ASSESSMENT AND PLAN / ED COURSE  As part of my medical decision making, I reviewed the following data within the [place]     Patient presents with urethral discharge for several days.  Patient has recurrent history of STDs.  Patient urine was taken today for send out lab results.  Patient will be notified telephonically of lab results if positive for GC and chlamydia.  Patient also advised to [facility] secondary to elevated blood pressure reading.    Steven Deleon was evaluated in Emergency Department on 10/08/2019 for the symptoms described in the history of present illness. He was evaluated in the context of the global COVID-19 pandemic, which necessitated consideration that the patient might be at risk for infection with the SARS-CoV-2 virus that causes COVID-19. Institutional protocols and algorithms that pertain to the evaluation of patients at risk for COVID-19 are in a state of rapid change based on information released by regulatory bodies including the CDC and federal and state organizations. These policies and algorithms were followed during the patient's care in the ED.       ____________________________________________   FINAL CLINICAL IMPRESSION(S) / ED DIAGNOSES  Final diagnoses:  Possible exposure to STD  Elevated blood pressure reading without diagnosis of hypertension     ED Discharge Orders    None       Note:  This document was prepared using Dragon voice recognition software and may include unintentional dictation errors.    Sable Feil, PA-C 10/08/19 1610    Earleen Newport, MD 10/08/19 1027

## 2019-10-08 NOTE — Discharge Instructions (Addendum)
Your GC and Chlamydia results are pending.  You will be notified telephonically positive results and follow-up for treatment.  Avoid sexual intercourse until tested and treated.  Was also noticed on your visit that your blood pressure is elevated.  Review of past visits show a increased elevation.  Advised to [facility] for definitive evaluation and treatment.

## 2019-10-08 NOTE — ED Notes (Signed)
C/p penile d/c X2 days and would like STD check/treatment

## 2019-10-10 ENCOUNTER — Encounter: Payer: Self-pay | Admitting: Emergency Medicine

## 2019-10-10 ENCOUNTER — Telehealth: Payer: Self-pay | Admitting: Emergency Medicine

## 2019-10-10 ENCOUNTER — Other Ambulatory Visit: Payer: Self-pay

## 2019-10-10 ENCOUNTER — Emergency Department
Admission: EM | Admit: 2019-10-10 | Discharge: 2019-10-10 | Disposition: A | Discharge status: Home / Self Care | Payer: Self-pay | Attending: Emergency Medicine | Admitting: Emergency Medicine

## 2019-10-10 DIAGNOSIS — R360 Urethral discharge without blood: Secondary | ICD-10-CM | POA: Insufficient documentation

## 2019-10-10 DIAGNOSIS — F17210 Nicotine dependence, cigarettes, uncomplicated: Secondary | ICD-10-CM | POA: Insufficient documentation

## 2019-10-10 DIAGNOSIS — A64 Unspecified sexually transmitted disease: Secondary | ICD-10-CM | POA: Insufficient documentation

## 2019-10-10 DIAGNOSIS — R369 Urethral discharge, unspecified: Secondary | ICD-10-CM

## 2019-10-10 LAB — GC/CHLAMYDIA PROBE AMP
Chlamydia trachomatis, NAA: NEGATIVE
Neisseria Gonorrhoeae by PCR: POSITIVE — AB

## 2019-10-10 MED ORDER — AZITHROMYCIN 500 MG PO TABS
1000.0000 mg | ORAL_TABLET | Freq: Once | ORAL | Status: AC
  Administered 2019-10-10: 1000 mg via ORAL
  Filled 2019-10-10: qty 2

## 2019-10-10 MED ORDER — CEFTRIAXONE SODIUM 250 MG IJ SOLR
250.0000 mg | Freq: Once | INTRAMUSCULAR | Status: AC
  Administered 2019-10-10: 250 mg via INTRAMUSCULAR
  Filled 2019-10-10: qty 250

## 2019-10-10 MED ORDER — LIDOCAINE HCL (PF) 1 % IJ SOLN
5.0000 mL | Freq: Once | INTRAMUSCULAR | Status: AC
  Administered 2019-10-10: 5 mL
  Filled 2019-10-10: qty 5

## 2019-10-10 NOTE — Discharge Instructions (Addendum)
Follow-up with [facility] if any continued problems.  Today you were treated for gonorrhea and chlamydia based on your symptoms and your sexual history.  You may also want to consider having other test done at the health department.  You will need to make an appointment for this.  Other STDs that you may not be aware of at this time are syphilis, HIV and hepatitis.  Read information on safe sex and also share information with your sexual partner who also needs to be tested.

## 2019-10-10 NOTE — Telephone Encounter (Addendum)
Called patient to assure he is aware of std test results .  He was treated during a second visit, but the test was not resulted at that time.  No answer and no voicemail.  Called again.  Says he is aware of results from mychart. Explained need for partner treatment.

## 2019-10-10 NOTE — ED Notes (Signed)
See triage note  States he was seen couple of days ago . States he is having penile discharge

## 2019-10-10 NOTE — ED Triage Notes (Addendum)
Patient to ER for treatment related to recent urinalysis results (+gonorrhea in August, urinalysis done again 2 days ago), patient continues to have penile discharge.

## 2019-10-10 NOTE — ED Provider Notes (Signed)
[facility] Emergency Department Provider Note  ____________________________________________   First MD Initiated Contact with Patient 10/10/19 [phone]     (approximate)  I have reviewed the triage vital signs and the nursing notes.   HISTORY  Chief Complaint Treatment for urinalysis results   HPI Steven Deleon is a 23 y.o. male presents to the ED with complaint of increased penile discharge.  Patient's was present 2 days ago and was seen in the ED for the same.  He states that he will to monitor this morning and saw that his urinalysis showed a large amount of WBCs.  He states that in the past this has been the same and that he knows he has gonorrhea.  He is unaware of whether or not his sexual partner has any symptoms.  He states that he did call the health department and they are unable to see him until Monday.  Patient admits he continues to have unprotected sex.       History reviewed. No pertinent past medical history.  There are no active problems to display for this patient.   Past Surgical History:  Procedure Laterality Date  . APPENDECTOMY      Prior to Admission medications   Medication Sig Start Date End Date Taking? Authorizing Provider  ibuprofen (ADVIL) 600 MG tablet Take 1 tablet (600 mg total) by mouth every 6 (six) hours as needed. 10/01/19   Jacqlyn Larsen, PA-C  methocarbamol (ROBAXIN) 500 MG tablet Take 1 tablet (500 mg total) by mouth 2 (two) times daily. 10/01/19   Jacqlyn Larsen, PA-C    Allergies Patient has no known allergies.  No family history on file.  Social History Social History   Tobacco Use  . Smoking status: Current Every Day Smoker    Packs/day: 0.50    Types: Cigarettes  . Smokeless tobacco: Never Used  Substance Use Topics  . Alcohol use: No  . Drug use: No    Review of Systems Constitutional: No fever/chills Cardiovascular: Denies chest pain. Respiratory: Denies shortness of breath.  Gastrointestinal: No abdominal pain.  No nausea, no vomiting. Genitourinary: Positive for dysuria and penile discharge. Musculoskeletal: Negative for back pain. Skin: Negative for rash. Neurological: Negative for headaches, focal weakness or numbness. ____________________________________________   PHYSICAL EXAM:  VITAL SIGNS: ED Triage Vitals [10/10/19 0811]  Enc Vitals Group     BP (!) 176/99     Pulse Rate 99     Resp 20     Temp 98.5 F (36.9 C)     Temp Source Oral     SpO2 100 %     Weight 169 lb 15.6 oz (77.1 kg)     Height 5\' 10"  (1.778 m)     Head Circumference      Peak Flow      Pain Score 0     Pain Loc      Pain Edu?      Excl. in [place]?    Constitutional: Alert and oriented. Well appearing and in no acute distress. Eyes: Conjunctivae are normal.  Head: Atraumatic. Neck: No stridor.   Cardiovascular: Normal rate, regular rhythm. Grossly normal heart sounds.  Good peripheral circulation. Respiratory: Normal respiratory effort.  No retractions. Lungs CTAB. Musculoskeletal: Moves upper and lower extremities with any difficulty normal gait was noted. Neurologic:  Normal speech and language. No gross focal neurologic deficits are appreciated. No gait instability. Skin:  Skin is warm, dry and intact. No rash noted. Psychiatric:  Mood and affect are normal. Speech and behavior are normal.  ____________________________________________   LABS (all labs ordered are listed, but only abnormal results are displayed)  Labs Reviewed - No data to display   PROCEDURES  Procedure(s) performed (including Critical Care):  Procedures   ____________________________________________   INITIAL IMPRESSION / ASSESSMENT AND PLAN / ED COURSE  As part of my medical decision making, I reviewed the following data within the electronic MEDICAL RECORD NUMBER Notes from prior ED visits and [place] Controlled Substance Database  23 year old male presents to the ED today after being seen 2  days ago at which time a urinalysis was taken.  He had a GC and Chlamydia test done which is not resulted yet.  He states that he is already seen the results of his urinalysis on my chart and complains that his penile discharge is getting worse.  He admits to continued unprotected sex.  He is unaware of whether his sexual partner at the time has any symptoms.  He has had multiple episodes of STDs and states that he wants this treated.  Urinalysis was reviewed and patient does have greater than 50 WBCs without bacteria.  Patient was given Zithromax 1 g p.o. and Rocephin 250 mg IM.  He is to consider going to the health department to be also checked for other STDs since he continues to refuse wearing a condom.  ____________________________________________   FINAL CLINICAL IMPRESSION(S) / ED DIAGNOSES  Final diagnoses:  Penile discharge  STI (sexually transmitted infection)     ED Discharge Orders    None       Note:  This document was prepared using Dragon voice recognition software and may include unintentional dictation errors.    Tommi Rumps, PA-C 10/10/19 1024    Concha Se, MD 10/11/19 [phone]

## 2019-10-18 ENCOUNTER — Emergency Department
Admission: EM | Admit: 2019-10-18 | Discharge: 2019-10-18 | Disposition: A | Discharge status: Home / Self Care | Payer: Self-pay | Attending: Emergency Medicine | Admitting: Emergency Medicine

## 2019-10-18 ENCOUNTER — Encounter: Payer: Self-pay | Admitting: Emergency Medicine

## 2019-10-18 ENCOUNTER — Other Ambulatory Visit: Payer: Self-pay

## 2019-10-18 DIAGNOSIS — F17210 Nicotine dependence, cigarettes, uncomplicated: Secondary | ICD-10-CM | POA: Insufficient documentation

## 2019-10-18 DIAGNOSIS — R300 Dysuria: Secondary | ICD-10-CM | POA: Insufficient documentation

## 2019-10-18 DIAGNOSIS — R369 Urethral discharge, unspecified: Secondary | ICD-10-CM | POA: Insufficient documentation

## 2019-10-18 DIAGNOSIS — Z202 Contact with and (suspected) exposure to infections with a predominantly sexual mode of transmission: Secondary | ICD-10-CM | POA: Insufficient documentation

## 2019-10-18 LAB — URINALYSIS, COMPLETE (UACMP) WITH MICROSCOPIC
Bacteria, UA: NONE SEEN
Bilirubin Urine: NEGATIVE
Glucose, UA: NEGATIVE mg/dL
Hgb urine dipstick: NEGATIVE
Ketones, ur: NEGATIVE mg/dL
Leukocytes,Ua: NEGATIVE
Nitrite: NEGATIVE
Protein, ur: NEGATIVE mg/dL
Specific Gravity, Urine: 1.023 (ref 1.005–1.030)
pH: 5 (ref 5.0–8.0)

## 2019-10-18 NOTE — Discharge Instructions (Addendum)
Urinalysis was unremarkable.  Advised follow-up with [facility] as needed.

## 2019-10-18 NOTE — ED Triage Notes (Signed)
Pt to ED via POV stating that he needs treatment for STD. Pt is in NAD.

## 2019-10-18 NOTE — ED Notes (Signed)
See triage note  Presents with penile discharge  Recently treated for [facility]

## 2019-10-18 NOTE — ED Provider Notes (Signed)
[facility] Emergency Department Provider Note   ____________________________________________   First MD Initiated Contact with Patient 10/18/19 1009     (approximate)  I have reviewed the triage vital signs and the nursing notes.   HISTORY  Chief Complaint SEXUALLY TRANSMITTED DISEASE    HPI Steven Deleon is a 23 y.o. male patient presents stating history of STD.  Patient was seen 8 days ago at this facility and treated with Chlamydia gonorrhea.  Patient still has the same symptoms consistent of dysuria and urethral discharge..  Patient admits to unprotected sexual intercourse.         History reviewed. No pertinent past medical history.  There are no problems to display for this patient.   Past Surgical History:  Procedure Laterality Date  . APPENDECTOMY      Prior to Admission medications   Not on File    Allergies Patient has no known allergies.  No family history on file.  Social History Social History   Tobacco Use  . Smoking status: Current Every Day Smoker    Packs/day: 0.50    Types: Cigarettes  . Smokeless tobacco: Never Used  Substance Use Topics  . Alcohol use: No  . Drug use: No    Review of Systems Constitutional: No fever/chills Eyes: No visual changes. ENT: No sore throat. Cardiovascular: Denies chest pain. Respiratory: Denies shortness of breath. Gastrointestinal: No abdominal pain.  No nausea, no vomiting.  No diarrhea.  No constipation. Genitourinary: Positive for dysuria. Musculoskeletal: Negative for back pain. Skin: Negative for rash. Neurological: Negative for headaches, focal weakness or numbness.   ____________________________________________   PHYSICAL EXAM:  VITAL SIGNS: ED Triage Vitals  Enc Vitals Group     BP 10/18/19 0946 (!) 160/99     Pulse Rate 10/18/19 0946 68     Resp 10/18/19 0946 18     Temp 10/18/19 0946 99.1 F (37.3 C)     Temp Source 10/18/19 0946 Oral   SpO2 10/18/19 0946 100 %     Weight --      Height --      Head Circumference --      Peak Flow --      Pain Score 10/18/19 0945 0     Pain Loc --      Pain Edu? --      Excl. in GC? --    Constitutional: Alert and oriented. Well appearing and in no acute distress. Neck: No stridor.  No cervical spine tenderness to palpation. Hematological/Lymphatic/Immunilogical: No cervical lymphadenopathy. Cardiovascular: Normal rate, regular rhythm. Grossly normal heart sounds.  Good peripheral circulation. Respiratory: Normal respiratory effort.  No retractions. Lungs CTAB. Gastrointestinal: Soft and nontender. No distention. No abdominal bruits. No CVA tenderness. Genitourinary: Patient gave urine sample prior to exam which shows no urethral discharge at this time.  No lesions. Skin:  Skin is warm, dry and intact. No rash noted.   ____________________________________________   LABS (all labs ordered are listed, but only abnormal results are displayed)  Labs Reviewed  URINALYSIS, COMPLETE (UACMP) WITH MICROSCOPIC - Abnormal; Notable for the following components:      Result Value   Color, Urine YELLOW (*)    APPearance CLEAR (*)    All other components within normal limits  GC/CHLAMYDIA PROBE AMP   ____________________________________________  EKG   ____________________________________________  RADIOLOGY  ED MD interpretation:    Official radiology report(s): No results found.  ____________________________________________   PROCEDURES  Procedure(s) performed (including Critical Care):  Procedures   ____________________________________________   INITIAL IMPRESSION / ASSESSMENT AND PLAN / ED COURSE  As part of my medical decision making, I reviewed the following data within the [place] [facility]     Requests STD testing secondary to possible exposure.  Patient was treated in this facility 8 days ago for same complaint.  His exam was grossly unremarkable.   Discussed negative lab results of the urinalysis.  Advised GC chlamydia is pending.  Decision is not to treat the patient for STD at this time.  Patient may follow-up with [facility].    [name] was evaluated in Emergency Department on 10/18/2019 for the symptoms described in the history of present illness. He was evaluated in the context of the global COVID-19 pandemic, which necessitated consideration that the patient might be at risk for infection with the SARS-CoV-2 virus that causes COVID-19. Institutional protocols and algorithms that pertain to the evaluation of patients at risk for COVID-19 are in a state of rapid change based on information released by regulatory bodies including the CDC and federal and state organizations. These policies and algorithms were followed during the patient's care in the ED.       ____________________________________________   FINAL CLINICAL IMPRESSION(S) / ED DIAGNOSES  Final diagnoses:  Possible exposure to STD     ED Discharge Orders    None       Note:  This document was prepared using Dragon voice recognition software and may include unintentional dictation errors.    Sable Feil, PA-C 10/18/19 1043    Blake Divine, MD 10/18/19 [phone]

## 2020-02-05 ENCOUNTER — Emergency Department
Admission: EM | Admit: 2020-02-05 | Discharge: 2020-02-05 | Disposition: A | Discharge status: Home / Self Care | Payer: Self-pay | Attending: Emergency Medicine | Admitting: Emergency Medicine

## 2020-02-05 ENCOUNTER — Other Ambulatory Visit: Payer: Self-pay

## 2020-02-05 DIAGNOSIS — F17210 Nicotine dependence, cigarettes, uncomplicated: Secondary | ICD-10-CM | POA: Insufficient documentation

## 2020-02-05 DIAGNOSIS — A64 Unspecified sexually transmitted disease: Secondary | ICD-10-CM | POA: Insufficient documentation

## 2020-02-05 LAB — CHLAMYDIA/NGC RT PCR (ARMC ONLY)
Chlamydia Tr: DETECTED — AB
N gonorrhoeae: DETECTED — AB

## 2020-02-05 MED ORDER — AZITHROMYCIN 500 MG PO TABS
1000.0000 mg | ORAL_TABLET | Freq: Once | ORAL | Status: AC
  Administered 2020-02-05: 1000 mg via ORAL
  Filled 2020-02-05: qty 2

## 2020-02-05 MED ORDER — CEFTRIAXONE SODIUM 250 MG IJ SOLR
250.0000 mg | Freq: Once | INTRAMUSCULAR | Status: AC
  Administered 2020-02-05: 250 mg via INTRAMUSCULAR
  Filled 2020-02-05: qty 250

## 2020-02-05 NOTE — ED Provider Notes (Signed)
[facility] Emergency Department Provider Note  ____________________________________________   First MD Initiated Contact with Patient 02/05/20 1502     (approximate)  I have reviewed the triage vital signs and the nursing notes.   HISTORY  Chief Complaint STD check    HPI Steven Deleon is a 24 y.o. male presents emergency department for the seventh or eighth time for his STD treatment.  Patient states he is having some yellow penile discharge.  No fever or chills.  Positive sexual contact without a condom.    History reviewed. No pertinent past medical history.  There are no problems to display for this patient.   Past Surgical History:  Procedure Laterality Date  . APPENDECTOMY      Prior to Admission medications   Not on File    Allergies Patient has no known allergies.  No family history on file.  Social History Social History   Tobacco Use  . Smoking status: Current Every Day Smoker    Packs/day: 0.50    Types: Cigarettes  . Smokeless tobacco: Never Used  Substance Use Topics  . Alcohol use: No  . Drug use: No    Review of Systems  Constitutional: No fever/chills Eyes: No visual changes. ENT: No sore throat. Respiratory: Denies cough Cardiovascular: Denies chest pain Gastrointestinal: Denies abdominal pain Genitourinary: Negative for dysuria.  Positive for yellow penile discharge Musculoskeletal: Negative for back pain. Skin: Negative for rash. Psychiatric: no mood changes,     ____________________________________________   PHYSICAL EXAM:  VITAL SIGNS: ED Triage Vitals [02/05/20 1339]  Enc Vitals Group     BP (!) 158/85     Pulse Rate 83     Resp 18     Temp 97.9 F (36.6 C)     Temp src      SpO2 100 %     Weight 175 lb (79.4 kg)     Height 5\' 9"  (1.753 m)     Head Circumference      Peak Flow      Pain Score 4     Pain Loc      Pain Edu?      Excl. in [place]?     Constitutional: Alert  and oriented. Well appearing and in no acute distress. Eyes: Conjunctivae are normal.  Head: Atraumatic. Nose: No congestion/rhinnorhea. Mouth/Throat: Mucous membranes are moist.   Neck:  supple no lymphadenopathy noted Cardiovascular: Normal rate, regular rhythm.  Respiratory: Normal respiratory effort.  No retractions,  GU: deferred by the patient Musculoskeletal: FROM all extremities, warm and well perfused Neurologic:  Normal speech and language.  Skin:  Skin is warm, dry and intact. No rash noted. Psychiatric: Mood and affect are normal. Speech and behavior are normal.  ____________________________________________   LABS (all labs ordered are listed, but only abnormal results are displayed)  Labs Reviewed  CHLAMYDIA/NGC RT PCR ([facility] ONLY)   ____________________________________________   ____________________________________________  RADIOLOGY    ____________________________________________   PROCEDURES  Procedure(s) performed: Rocephin 250 mg IM, Zithromax 1 g p.o.   Procedures    ____________________________________________   INITIAL IMPRESSION / ASSESSMENT AND PLAN / ED COURSE  Pertinent labs & imaging results that were available during my care of the patient were reviewed by me and considered in my medical decision making (see chart for details).   Patient is a 24 year old male presents emergency department for STD.  Patient has been here multiple times for his STDs.  I had a very stern discussion  with him stating that the [facility] department is the proper place to go for STDs.  The emergency department is not.  Told him we would treat him today due to the yellow discharge.  He is to follow-up with [facility] department.  He was discharged stable condition.    Steven Deleon was evaluated in Emergency Department on 02/05/2020 for the symptoms described in the history of present illness. He was evaluated in the  context of the global COVID-19 pandemic, which necessitated consideration that the patient might be at risk for infection with the SARS-CoV-2 virus that causes COVID-19. Institutional protocols and algorithms that pertain to the evaluation of patients at risk for COVID-19 are in a state of rapid change based on information released by regulatory bodies including the CDC and federal and state organizations. These policies and algorithms were followed during the patient's care in the ED.   As part of my medical decision making, I reviewed the following data within the electronic MEDICAL RECORD NUMBER Nursing notes reviewed and incorporated, Old chart reviewed, Notes from prior ED visits and [place] Controlled Substance Database  ____________________________________________   FINAL CLINICAL IMPRESSION(S) / ED DIAGNOSES  Final diagnoses:  STD (male)      NEW MEDICATIONS STARTED DURING THIS VISIT:  New Prescriptions   No medications on file     Note:  This document was prepared using Dragon voice recognition software and may include unintentional dictation errors.    Faythe Ghee, PA-C 02/05/20 1606    Emily Filbert, MD 02/05/20 [phone]

## 2020-02-05 NOTE — ED Notes (Signed)
Pt states unable to provide urine sample at this time. Water provided

## 2020-02-05 NOTE — ED Notes (Addendum)
Pt states he has a discharge he needs to be treated for. Pt states he's had gonorrhea and that this is similar to it.

## 2020-02-05 NOTE — Discharge Instructions (Signed)
You need to have all of your STDs treated at the health department.  The emergency department is not the proper place to get your STD treatment.  Please follow-up there

## 2020-02-05 NOTE — ED Triage Notes (Signed)
Pt here for STD ck. Pt states some penile discharge and pain that he noticed today.

## 2020-02-06 ENCOUNTER — Telehealth: Payer: Self-pay | Admitting: Emergency Medicine

## 2020-02-06 NOTE — Telephone Encounter (Signed)
Called patient to give std results--positive gc and chlamydia.  He was treated during ED visit.  No answer and no voicemail.

## 2020-07-01 IMAGING — DX DG CHEST 1V PORT
1 series · 1 of 1 positions shown · non-contrast
Comparison: 04/05/2018

CLINICAL DATA: Cough, congestion, lower body aches

EXAM:
PORTABLE CHEST 1 VIEW

[chest ap]
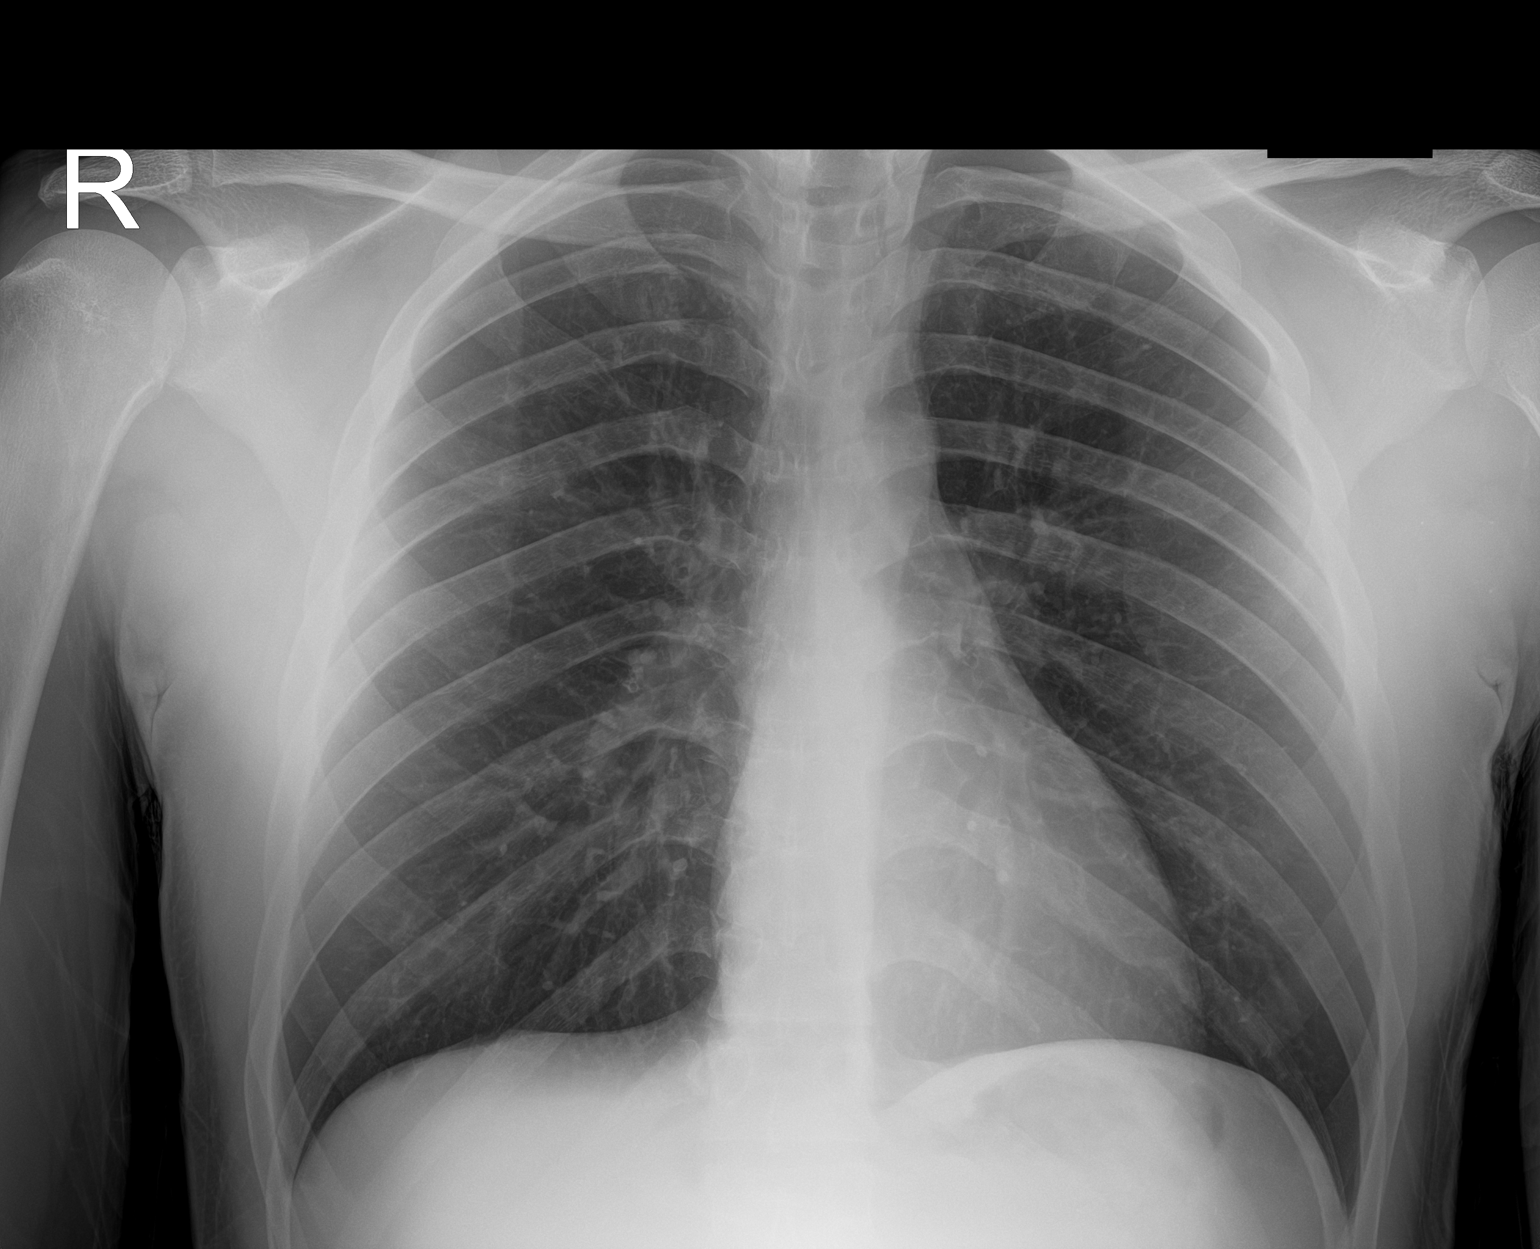

[1 of 1 positions shown; findings below may reference images not displayed]

FINDINGS: Lungs are clear.  No pleural effusion or pneumothorax.

The heart is normal in size.
IMPRESSION: No evidence of acute cardiopulmonary disease.

## 2021-02-01 ENCOUNTER — Emergency Department
Admission: EM | Admit: 2021-02-01 | Discharge: 2021-02-01 | Disposition: A | Discharge status: Home / Self Care | Payer: Self-pay | Attending: Emergency Medicine | Admitting: Emergency Medicine

## 2021-02-01 ENCOUNTER — Other Ambulatory Visit: Payer: Self-pay

## 2021-02-01 ENCOUNTER — Encounter: Payer: Self-pay | Admitting: Emergency Medicine

## 2021-02-01 DIAGNOSIS — N341 Nonspecific urethritis: Secondary | ICD-10-CM | POA: Insufficient documentation

## 2021-02-01 DIAGNOSIS — Z711 Person with feared health complaint in whom no diagnosis is made: Secondary | ICD-10-CM

## 2021-02-01 DIAGNOSIS — F17210 Nicotine dependence, cigarettes, uncomplicated: Secondary | ICD-10-CM | POA: Insufficient documentation

## 2021-02-01 LAB — URINALYSIS, COMPLETE (UACMP) WITH MICROSCOPIC
Bacteria, UA: NONE SEEN
Bilirubin Urine: NEGATIVE
Glucose, UA: NEGATIVE mg/dL
Hgb urine dipstick: NEGATIVE
Ketones, ur: NEGATIVE mg/dL
Nitrite: NEGATIVE
Protein, ur: NEGATIVE mg/dL
Specific Gravity, Urine: 1.025 (ref 1.005–1.030)
Squamous Epithelial / HPF: NONE SEEN (ref 0–5)
WBC, UA: >50 WBC/hpf — ABNORMAL HIGH (ref 0–5)
pH: 7 (ref 5.0–8.0)

## 2021-02-01 LAB — CHLAMYDIA/NGC RT PCR (ARMC ONLY)
Chlamydia Tr: NOT DETECTED
N gonorrhoeae: DETECTED — AB

## 2021-02-01 MED ORDER — DOXYCYCLINE HYCLATE 100 MG PO CAPS: 100.0000 mg | ORAL_CAPSULE | Freq: Two times a day (BID) | ORAL | 0 refills | Status: AC

## 2021-02-01 MED ORDER — LIDOCAINE HCL (PF) 1 % IJ SOLN
5.0000 mL | Freq: Once | INTRAMUSCULAR | Status: AC
  Administered 2021-02-01: 5 mL
  Filled 2021-02-01: qty 5

## 2021-02-01 MED ORDER — CEFTRIAXONE SODIUM 1 G IJ SOLR
500.0000 mg | Freq: Once | INTRAMUSCULAR | Status: AC
  Administered 2021-02-01: 500 mg via INTRAMUSCULAR
  Filled 2021-02-01: qty 10

## 2021-02-01 NOTE — ED Notes (Signed)
See triage note  Presents with penile discharge  And dysuria

## 2021-02-01 NOTE — ED Triage Notes (Signed)
Pt to ED via POV, states dysuria and noted some penile discharge that was white with possible yellow discharge. Pt denies new sexual partners at this time. Pt states noted dysuria yesterday.   Pt also asking what could cause heart fluttering, states has been intermittent, this RN offered work up, pt declined cardiac workup, states is only here for treatment for poss yeast infection/possible STD.

## 2021-02-01 NOTE — Discharge Instructions (Addendum)
You will be able to see the results of your test on MyChart later this afternoon.  Have your partner follow-up with the [facility] department where she can be tested and treated for free.  Information is given to you on a separate paper for her to call.  While in the emergency department you were given a Rocephin injection which will treat gonorrhea.  A prescription for doxycycline was sent to your pharmacy this is 1 tablet twice a day for the next 7 days.  You will need to take all 7 days.  Do not stop taking it if your symptoms improve.  Increase fluids.  No sexual contact until your partner has been completely treated as well if your test results are positive for gonorrhea or chlamydia.

## 2021-02-01 NOTE — ED Provider Notes (Signed)
[facility] Emergency Department Provider Note  ____________________________________________   Event Date/Time   First MD Initiated Contact with Patient 02/01/21 [phone]     (approximate)  I have reviewed the triage vital signs and the nursing notes.   HISTORY  Chief Complaint Exposure to STD   HPI Steven Deleon is a 25 y.o. male to the ED with concerns of penile discharge and dysuria.  Describes discharge as possibly yellow.  Patient states that he is not aware that his sexual partner is having any difficulty this time.  Patient also mention to the triage nurse that he had some "heart fluttering" but states he has had problems with panic attacks in the past.  He denies any shortness of breath or difficulty breathing at this time.  He is adamant that he does not want a cardiac work-up at this time and that he believes this to be anxiety driven.  Patient also states he has "a busy day" and cannot wait for any this and therefore refuses to have this worked up.  He rates his pain today as 6 out of 10.       History reviewed. No pertinent past medical history.  There are no problems to display for this patient.   Past Surgical History:  Procedure Laterality Date  . APPENDECTOMY      Prior to Admission medications   Medication Sig Start Date End Date Taking? Authorizing Provider  doxycycline (VIBRAMYCIN) 100 MG capsule Take 1 capsule (100 mg total) by mouth 2 (two) times daily for 7 days. 02/01/21 02/08/21 Yes Tommi Rumps, PA-C    Allergies Patient has no known allergies.  History reviewed. No pertinent family history.  Social History Social History   Tobacco Use  . Smoking status: Current Every Day Smoker    Packs/day: 0.50    Types: Cigarettes  . Smokeless tobacco: Never Used  Substance Use Topics  . Alcohol use: No  . Drug use: No    Review of Systems  Constitutional: No fever/chills Eyes: No visual changes. Cardiovascular:  Denies chest pain.  Complaint of "heart fluttering" Respiratory: Denies shortness of breath. Gastrointestinal: No abdominal pain.  No nausea, no vomiting.  Genitourinary: Positive for  dysuria and penile discharge. Musculoskeletal: Negative for back pain. Skin: Negative for rash. Neurological: Negative for headaches, focal weakness or numbness.   ____________________________________________   PHYSICAL EXAM:  VITAL SIGNS: ED Triage Vitals  Enc Vitals Group     BP 02/01/21 0834 (!) 154/94     Pulse Rate 02/01/21 0834 63     Resp 02/01/21 0834 18     Temp 02/01/21 0834 98.2 F (36.8 C)     Temp Source 02/01/21 0834 Oral     SpO2 02/01/21 0834 98 %     Weight 02/01/21 0834 170 lb (77.1 kg)     Height 02/01/21 0834 5\' 9"  (1.753 m)     Head Circumference --      Peak Flow --      Pain Score 02/01/21 0842 6     Pain Loc --      Pain Edu? --      Excl. in GC? --     Constitutional: Alert and oriented. Well appearing and in no acute distress. Eyes: Conjunctivae are normal.  Head: Atraumatic. Neck: No stridor.   Cardiovascular: Normal rate, regular rhythm. Grossly normal heart sounds.  Good peripheral circulation. Respiratory: Normal respiratory effort.  No retractions. Lungs CTAB. Gastrointestinal: Soft and nontender. No  distention.  Neurologic:  Normal speech and language. No gross focal neurologic deficits are appreciated. No gait instability. Skin:  Skin is warm, dry and intact. No rash noted. Psychiatric: Mood and affect are normal. Speech and behavior are normal.  ____________________________________________   LABS (all labs ordered are listed, but only abnormal results are displayed)  Labs Reviewed  CHLAMYDIA/NGC RT PCR ([facility] ONLY) - Abnormal; Notable for the following components:      Result Value   N gonorrhoeae DETECTED (*)    All other components within normal limits  URINALYSIS, COMPLETE (UACMP) WITH MICROSCOPIC - Abnormal; Notable for the following  components:   Color, Urine YELLOW (*)    APPearance HAZY (*)    Leukocytes,Ua SMALL (*)    WBC, UA >50 (*)    All other components within normal limits    PROCEDURES  Procedure(s) performed (including Critical Care):  Procedures   ____________________________________________   INITIAL IMPRESSION / ASSESSMENT AND PLAN / ED COURSE  As part of my medical decision making, I reviewed the following data within the electronic MEDICAL RECORD NUMBER Notes from prior ED visits and [place] Controlled Substance Database  25 year old male presents to the ED with complaint of yellow penile discharge that began 2 days ago.  Patient is here for concerns of an STD.  He is unaware of his partner having any difficulties at this time.  Urinalysis showed greater than 50,000 WBCs.  Patient was treated with Rocephin and doxycycline.  He was made aware that he can see the results of his test on MyChart and that his partner would need to be treated if his test come back positive.  At the time of finishing this chart test results were positive for gonorrhea and patient was notified by phone.  Birthdate and last name were verified prior to releasing this information.  Patient is aware that partner will definitely need to be treated. ____________________________________________   FINAL CLINICAL IMPRESSION(S) / ED DIAGNOSES  Final diagnoses:  Urethritis, nonspecific  Concern about STD in male without diagnosis     ED Discharge Orders         Ordered    doxycycline (VIBRAMYCIN) 100 MG capsule  2 times daily        02/01/21 0949          *Please note:  Steven Deleon was evaluated in Emergency Department on 02/01/2021 for the symptoms described in the history of present illness. He was evaluated in the context of the global COVID-19 pandemic, which necessitated consideration that the patient might be at risk for infection with the SARS-CoV-2 virus that causes COVID-19. Institutional protocols and algorithms  that pertain to the evaluation of patients at risk for COVID-19 are in a state of rapid change based on information released by regulatory bodies including the CDC and federal and state organizations. These policies and algorithms were followed during the patient's care in the ED.  Some ED evaluations and interventions may be delayed as a result of limited staffing during and the pandemic.*   Note:  This document was prepared using Dragon voice recognition software and may include unintentional dictation errors.    Tommi Rumps, PA-C 02/01/21 1144    Chesley Noon, MD 02/01/21 [phone]

## 2021-02-14 ENCOUNTER — Other Ambulatory Visit: Payer: Self-pay

## 2021-02-14 ENCOUNTER — Emergency Department
Admission: EM | Admit: 2021-02-14 | Discharge: 2021-02-14 | Disposition: A | Discharge status: Home / Self Care | Payer: Self-pay | Attending: Emergency Medicine | Admitting: Emergency Medicine

## 2021-02-14 ENCOUNTER — Encounter: Payer: Self-pay | Admitting: *Deleted

## 2021-02-14 DIAGNOSIS — R369 Urethral discharge, unspecified: Secondary | ICD-10-CM | POA: Insufficient documentation

## 2021-02-14 DIAGNOSIS — F17210 Nicotine dependence, cigarettes, uncomplicated: Secondary | ICD-10-CM | POA: Insufficient documentation

## 2021-02-14 DIAGNOSIS — Z202 Contact with and (suspected) exposure to infections with a predominantly sexual mode of transmission: Secondary | ICD-10-CM | POA: Insufficient documentation

## 2021-02-14 LAB — URINALYSIS, COMPLETE (UACMP) WITH MICROSCOPIC
Bacteria, UA: NONE SEEN
Bilirubin Urine: NEGATIVE
Glucose, UA: NEGATIVE mg/dL
Hgb urine dipstick: NEGATIVE
Ketones, ur: NEGATIVE mg/dL
Leukocytes,Ua: NEGATIVE
Nitrite: NEGATIVE
Protein, ur: NEGATIVE mg/dL
Specific Gravity, Urine: 1.025 (ref 1.005–1.030)
Squamous Epithelial / HPF: NONE SEEN (ref 0–5)
pH: 5 (ref 5.0–8.0)

## 2021-02-14 LAB — CHLAMYDIA/NGC RT PCR (ARMC ONLY)
Chlamydia Tr: NOT DETECTED
N gonorrhoeae: DETECTED — AB

## 2021-02-14 MED ORDER — METRONIDAZOLE 500 MG PO TABS
2000.0000 mg | ORAL_TABLET | Freq: Once | ORAL | Status: AC
  Administered 2021-02-14: 2000 mg via ORAL
  Filled 2021-02-14: qty 4

## 2021-02-14 MED ORDER — CEFTRIAXONE SODIUM 1 G IJ SOLR
500.0000 mg | Freq: Once | INTRAMUSCULAR | Status: AC
  Administered 2021-02-14: 500 mg via INTRAMUSCULAR
  Filled 2021-02-14: qty 10

## 2021-02-14 MED ORDER — DOXYCYCLINE MONOHYDRATE 100 MG PO TABS: 100.0000 mg | ORAL_TABLET | Freq: Two times a day (BID) | ORAL | 0 refills | Status: AC

## 2021-02-14 NOTE — ED Provider Notes (Signed)
[facility]-EMERGENCY DEPARTMENT  ____________________________________________  Time seen: Approximately 4:07 PM  I have reviewed the triage vital signs and the nursing notes.   HISTORY  Chief Complaint Penile Discharge   Historian Patient     HPI Steven Deleon is a 25 y.o. male presents to the emergency department with penile discharge.  Patient reports that his partner recently tested positive for gonorrhea and they have been having unprotected sex.  He denies dysuria, low back pain, nausea or vomiting.  Patient reports he is hungry and would like to be discharged from the emergency department soon as possible.   History reviewed. No pertinent past medical history.   Immunizations up to date:  Yes.     History reviewed. No pertinent past medical history.  There are no problems to display for this patient.   Past Surgical History:  Procedure Laterality Date  . APPENDECTOMY      Prior to Admission medications   Medication Sig Start Date End Date Taking? Authorizing Provider  doxycycline (ADOXA) 100 MG tablet Take 1 tablet (100 mg total) by mouth 2 (two) times daily for 7 days. 02/14/21 02/21/21 Yes Orvil Feil, PA-C    Allergies Patient has no known allergies.  No family history on file.  Social History Social History   Tobacco Use  . Smoking status: Current Every Day Smoker    Packs/day: 0.50    Types: Cigarettes  . Smokeless tobacco: Never Used  Substance Use Topics  . Alcohol use: Yes  . Drug use: No     Review of Systems  Constitutional: No fever/chills Eyes:  No discharge ENT: No upper respiratory complaints. Respiratory: no cough. No SOB/ use of accessory muscles to breath Gastrointestinal:   No nausea, no vomiting.  No diarrhea.  No constipation. Genitourinary: Patient has penile discharge.  Musculoskeletal: Negative for musculoskeletal pain. Skin: Negative for rash, abrasions, lacerations,  ecchymosis.    ____________________________________________   PHYSICAL EXAM:  VITAL SIGNS: ED Triage Vitals  Enc Vitals Group     BP 02/14/21 1536 (!) 152/87     Pulse Rate 02/14/21 1536 65     Resp 02/14/21 1536 16     Temp 02/14/21 1536 98.6 F (37 C)     Temp Source 02/14/21 1536 Oral     SpO2 02/14/21 1536 98 %     Weight 02/14/21 1533 175 lb (79.4 kg)     Height 02/14/21 1533 5\' 9"  (1.753 m)     Head Circumference --      Peak Flow --      Pain Score 02/14/21 1533 0     Pain Loc --      Pain Edu? --      Excl. in GC? --      Constitutional: Alert and oriented. Well appearing and in no acute distress. Eyes: Conjunctivae are normal. PERRL. EOMI. Head: Atraumatic. ENT:      Nose: No congestion/rhinnorhea.      Mouth/Throat: Mucous membranes are moist.  Neck: No stridor.  No cervical spine tenderness to palpation. Hematological/Lymphatic/Immunilogical: No cervical lymphadenopathy. Cardiovascular: Normal rate, regular rhythm. Normal S1 and S2.  Good peripheral circulation. Respiratory: Normal respiratory effort without tachypnea or retractions. Lungs CTAB. Good air entry to the bases with no decreased or absent breath sounds Gastrointestinal: Bowel sounds x 4 quadrants. Soft and nontender to palpation. No guarding or rigidity. No distention. Musculoskeletal: Full range of motion to all extremities. No obvious deformities noted Neurologic:  Normal for age. No gross focal  neurologic deficits are appreciated.  Skin:  Skin is warm, dry and intact. No rash noted. Psychiatric: Mood and affect are normal for age. Speech and behavior are normal.   ____________________________________________   LABS (all labs ordered are listed, but only abnormal results are displayed)  Labs Reviewed  CHLAMYDIA/NGC RT PCR ([facility] ONLY)  URINALYSIS, COMPLETE (UACMP) WITH MICROSCOPIC    ____________________________________________  EKG   ____________________________________________  RADIOLOGY   No results found.  ____________________________________________    PROCEDURES  Procedure(s) performed:     Procedures     Medications  cefTRIAXone (ROCEPHIN) injection 500 mg (has no administration in time range)  metroNIDAZOLE (FLAGYL) tablet 2,000 mg (has no administration in time range)     ____________________________________________   INITIAL IMPRESSION / ASSESSMENT AND PLAN / ED COURSE  Pertinent labs & imaging results that were available during my care of the patient were reviewed by me and considered in my medical decision making (see chart for details).    Assessment and Plan:   STD exposure 25 year old male presents to the emergency department with penile discharge for the past 2 days and concern for gonorrhea  Patient was hypertensive at triage but vital signs were otherwise reassuring.  He received an injection of Rocephin and 2 g of Flagyl in the emergency department.  He was discharged with doxycycline twice daily for the next 7 days.  He was advised to abstain from unprotected sex for 7 days.  Return precautions were given to return with new or worsening symptoms.     ____________________________________________  FINAL CLINICAL IMPRESSION(S) / ED DIAGNOSES  Final diagnoses:  STD exposure      NEW MEDICATIONS STARTED DURING THIS VISIT:  ED Discharge Orders         Ordered    doxycycline (ADOXA) 100 MG tablet  2 times daily        02/14/21 1603              This chart was dictated using voice recognition software/Dragon. Despite best efforts to proofread, errors can occur which can change the meaning. Any change was purely unintentional.     Orvil Feil, PA-C 02/14/21 1609    Chesley Noon, MD 02/14/21 [phone]

## 2021-02-14 NOTE — Discharge Instructions (Addendum)
No unprotected sex for seven days.  Take Doxycycline twice daily for seven days.

## 2021-02-14 NOTE — ED Notes (Signed)
See triage note  States he was seen about 2 weeks ago for same  Tested positive for GC was treated but thinks that his partner was not treated    Developed some penile discharge this past weekend

## 2021-02-14 NOTE — ED Triage Notes (Signed)
Pt reports penile discharge since yesterday.  Partner called pt and reports positive for gonorrhea.   Pt alert  Denies urinary sx.

## 2021-04-11 ENCOUNTER — Other Ambulatory Visit: Payer: Self-pay

## 2021-04-11 ENCOUNTER — Emergency Department
Admission: EM | Admit: 2021-04-11 | Discharge: 2021-04-11 | Disposition: A | Discharge status: Home / Self Care | Payer: Self-pay | Attending: Emergency Medicine | Admitting: Emergency Medicine

## 2021-04-11 DIAGNOSIS — A549 Gonococcal infection, unspecified: Secondary | ICD-10-CM | POA: Insufficient documentation

## 2021-04-11 DIAGNOSIS — F17210 Nicotine dependence, cigarettes, uncomplicated: Secondary | ICD-10-CM | POA: Insufficient documentation

## 2021-04-11 LAB — URINALYSIS, COMPLETE (UACMP) WITH MICROSCOPIC
Bacteria, UA: NONE SEEN
Bilirubin Urine: NEGATIVE
Glucose, UA: NEGATIVE mg/dL
Hgb urine dipstick: NEGATIVE
Ketones, ur: NEGATIVE mg/dL
Nitrite: NEGATIVE
Protein, ur: NEGATIVE mg/dL
Specific Gravity, Urine: 1.028 (ref 1.005–1.030)
Squamous Epithelial / HPF: NONE SEEN (ref 0–5)
pH: 6 (ref 5.0–8.0)

## 2021-04-11 LAB — CHLAMYDIA/NGC RT PCR (ARMC ONLY)
Chlamydia Tr: NOT DETECTED
N gonorrhoeae: DETECTED — AB

## 2021-04-11 MED ORDER — CEFTRIAXONE SODIUM 1 G IJ SOLR
500.0000 mg | Freq: Once | INTRAMUSCULAR | Status: AC
  Administered 2021-04-11: 500 mg via INTRAMUSCULAR
  Filled 2021-04-11: qty 10

## 2021-04-11 NOTE — ED Triage Notes (Signed)
Pt states he would like to be checked for an std. Pt states he does not have penile discharge, but does have "sore glands".

## 2021-04-11 NOTE — Discharge Instructions (Signed)
You will need to wait at least 7 full days after treatment to engage in oral, vaginal or anal intercourse.  Steps to find a Primary Care Provider (PCP):  Call [phone] or [phone] to access "[place] Find a Doctor Service."  2.  You may also go on the [facility] website at [email or web address]

## 2021-04-11 NOTE — ED Provider Notes (Signed)
[facility] Emergency Department Provider Note  ____________________________________________   Event Date/Time   First MD Initiated Contact with Patient 04/11/21 [phone]     (approximate)  I have reviewed the triage vital signs and the nursing notes.   HISTORY  Chief Complaint STD check    HPI Steven Deleon is a 25 y.o. male with history of previous STDs who presents to the emergency department with concerns that he has an STD again.  States he was treated for gonorrhea in April but did not wait a full 7 days before having intercourse with his partner.  He states he has had some sore throat and has been having oral sex with his partner.  He reports he did have vomiting several days ago but none since.  No diarrhea.  No fever.  Denies abdominal pain, dysuria, hematuria, discharge.  No eye redness, drainage.        No past medical history on file.  There are no problems to display for this patient.   Past Surgical History:  Procedure Laterality Date  . APPENDECTOMY      Prior to Admission medications   Not on File    Allergies Patient has no known allergies.  No family history on file.  Social History Social History   Tobacco Use  . Smoking status: Current Every Day Smoker    Packs/day: 0.50    Types: Cigarettes  . Smokeless tobacco: Never Used  Substance Use Topics  . Alcohol use: Yes  . Drug use: No    Review of Systems Constitutional: No fever. Eyes: No visual changes. ENT: No sore throat. Cardiovascular: Denies chest pain. Respiratory: Denies shortness of breath. Gastrointestinal: No nausea, vomiting, diarrhea. Genitourinary: Negative for dysuria. Musculoskeletal: Negative for back pain. Skin: Negative for rash. Neurological: Negative for focal weakness or numbness.  ____________________________________________   PHYSICAL EXAM:  VITAL SIGNS: ED Triage Vitals  Enc Vitals Group     BP 04/11/21 0047 (!) 150/87      Pulse Rate 04/11/21 0047 67     Resp 04/11/21 0047 16     Temp 04/11/21 0047 97.9 F (36.6 C)     Temp Source 04/11/21 0047 Oral     SpO2 04/11/21 0047 98 %     Weight 04/11/21 0050 180 lb (81.6 kg)     Height 04/11/21 0050 5\' 10"  (1.778 m)     Head Circumference --      Peak Flow --      Pain Score 04/11/21 0050 0     Pain Loc --      Pain Edu? --      Excl. in GC? --    CONSTITUTIONAL: Alert and oriented and responds appropriately to questions. Well-appearing; well-nourished HEAD: Normocephalic EYES: Conjunctivae clear, pupils appear equal, EOM appear intact ENT: normal nose; moist mucous membranes;No pharyngeal erythema or petechiae, no tonsillar hypertrophy or exudate, no uvular deviation, no unilateral swelling, no trismus or drooling, no muffled voice, normal phonation, no stridor, no dental caries present, no drainable dental abscess noted, no Ludwig's angina, tongue sits flat in the bottom of the mouth, no angioedema, no facial erythema or warmth, no facial swelling; no pain with movement of the neck, no cervical LAD. NECK: Supple, normal ROM CARD: RRR; S1 and S2 appreciated; no murmurs, no clicks, no rubs, no gallops RESP: Normal chest excursion without splinting or tachypnea; breath sounds clear and equal bilaterally; no wheezes, no rhonchi, no rales, no hypoxia or respiratory distress, speaking  full sentences ABD/GI: Normal bowel sounds; non-distended; soft, non-tender, no rebound, no guarding, no peritoneal signs, no hepatosplenomegaly BACK: The back appears normal EXT: Normal ROM in all joints; no deformity noted, no edema; no cyanosis SKIN: Normal color for age and race; warm; no rash on exposed skin NEURO: Moves all extremities equally PSYCH: The patient's mood and manner are appropriate.  ____________________________________________   LABS (all labs ordered are listed, but only abnormal results are displayed)  Labs Reviewed  CHLAMYDIA/NGC RT PCR ([facility]  ONLY) - Abnormal; Notable for the following components:      Result Value   N gonorrhoeae DETECTED (*)    All other components within normal limits  URINALYSIS, COMPLETE (UACMP) WITH MICROSCOPIC - Abnormal; Notable for the following components:   Color, Urine YELLOW (*)    APPearance CLEAR (*)    Leukocytes,Ua TRACE (*)    All other components within normal limits   ____________________________________________  EKG   ____________________________________________  RADIOLOGY I, Rena Sweeden, personally viewed and evaluated these images (plain radiographs) as part of my medical decision making, as well as reviewing the written report by the radiologist.  ED MD interpretation:    Official radiology report(s): No results found.  ____________________________________________   PROCEDURES  Procedure(s) performed (including Critical Care):  Procedures   ____________________________________________   INITIAL IMPRESSION / ASSESSMENT AND PLAN / ED COURSE  As part of my medical decision making, I reviewed the following data within the electronic MEDICAL RECORD NUMBER Nursing notes reviewed and incorporated, Labs reviewed , Old chart reviewed and Notes from prior ED visits         Patient here with gonorrhea.  Chlamydia negative.  States he has having sore throat and vomiting a few days ago.  No signs of tonsillitis, pharyngitis, deep space neck infection, PTA on exam.  Will treat with Rocephin here and have recommended that he avoid sexual intercourse for at least the next 7 days after treatment.  His partner is here and will be treated as well.  He denies being sexually active with anyone other than his girlfriend.  Offered HIV, syphilis screening today which he declined.  Given health department follow-up information.  At this time, I do not feel there is any life-threatening condition present. I have reviewed, interpreted and discussed all results (EKG, imaging, lab, urine  as appropriate) and exam findings with patient/family. I have reviewed nursing notes and appropriate previous records.  I feel the patient is safe to be discharged home without further emergent workup and can continue workup as an outpatient as needed. Discussed usual and customary return precautions. Patient/family verbalize understanding and are comfortable with this plan.  Outpatient follow-up has been provided as needed. All questions have been answered.  ____________________________________________   FINAL CLINICAL IMPRESSION(S) / ED DIAGNOSES  Final diagnoses:  Gonorrhea     ED Discharge Orders    None      *Please note:  Steven Deleon was evaluated in Emergency Department on 04/11/2021 for the symptoms described in the history of present illness. He was evaluated in the context of the global COVID-19 pandemic, which necessitated consideration that the patient might be at risk for infection with the SARS-CoV-2 virus that causes COVID-19. Institutional protocols and algorithms that pertain to the evaluation of patients at risk for COVID-19 are in a state of rapid change based on information released by regulatory bodies including the CDC and federal and state organizations. These policies and algorithms were followed during the patient's care in  the ED.  Some ED evaluations and interventions may be delayed as a result of limited staffing during and the pandemic.*   Note:  This document was prepared using Dragon voice recognition software and may include unintentional dictation errors.   Steven Deleon, Layla Maw, DO 04/11/21 [phone]

## 2021-09-21 ENCOUNTER — Emergency Department (HOSPITAL_COMMUNITY)
Admission: EM | Admit: 2021-09-21 | Discharge: 2021-09-21 | Disposition: A | Discharge status: Home / Self Care | Payer: Self-pay | Attending: Emergency Medicine | Admitting: Emergency Medicine

## 2021-09-21 ENCOUNTER — Other Ambulatory Visit: Payer: Self-pay

## 2021-09-21 ENCOUNTER — Encounter (HOSPITAL_COMMUNITY): Payer: Self-pay | Admitting: *Deleted

## 2021-09-21 DIAGNOSIS — F17210 Nicotine dependence, cigarettes, uncomplicated: Secondary | ICD-10-CM | POA: Insufficient documentation

## 2021-09-21 DIAGNOSIS — Z202 Contact with and (suspected) exposure to infections with a predominantly sexual mode of transmission: Secondary | ICD-10-CM | POA: Insufficient documentation

## 2021-09-21 LAB — URINALYSIS, ROUTINE W REFLEX MICROSCOPIC
Bilirubin Urine: NEGATIVE
Glucose, UA: NEGATIVE mg/dL
Hgb urine dipstick: NEGATIVE
Ketones, ur: NEGATIVE mg/dL
Leukocytes,Ua: NEGATIVE
Nitrite: NEGATIVE
Protein, ur: NEGATIVE mg/dL
Specific Gravity, Urine: 1.024 (ref 1.005–1.030)
pH: 6 (ref 5.0–8.0)

## 2021-09-21 MED ORDER — METRONIDAZOLE 500 MG PO TABS
2000.0000 mg | ORAL_TABLET | Freq: Once | ORAL | Status: AC
  Administered 2021-09-21: 2000 mg via ORAL
  Filled 2021-09-21: qty 4

## 2021-09-21 NOTE — ED Provider Notes (Signed)
  [facility] EMERGENCY DEPARTMENT Provider Note   CSN: [id number] Arrival date & time: 09/21/21  1622     History Chief Complaint  Patient presents with   poss std    Steven Deleon is a 25 y.o. male presenting for treatment for trichomonas.  His partner tested positive for this and he would like to be treated.  She tested negative for all other STDs.  HPI     History reviewed. No pertinent past medical history.  There are no problems to display for this patient.   Past Surgical History:  Procedure Laterality Date   APPENDECTOMY         No family history on file.  Social History   Tobacco Use   Smoking status: Every Day    Packs/day: 0.50    Types: Cigarettes    Passive exposure: Past   Smokeless tobacco: Never  Substance Use Topics   Alcohol use: Yes   Drug use: No    Home Medications Prior to Admission medications   Not on File    Allergies    Patient has no known allergies.  Review of Systems   Review of Systems  Genitourinary:  Positive for dysuria.  All other systems reviewed and are negative.  Physical Exam Updated Vital Signs BP (!) 164/103   Pulse 62   Temp 98.6 F (37 C)   Resp 16   Ht 5\' 10"  (1.778 m)   Wt 81.6 kg   SpO2 99%   BMI 25.81 kg/m   Physical Exam Vitals and nursing note reviewed.  Constitutional:      Appearance: Normal appearance.  HENT:     Head: Normocephalic and atraumatic.  Eyes:     General: No scleral icterus.    Conjunctiva/sclera: Conjunctivae normal.  Pulmonary:     Effort: Pulmonary effort is normal. No respiratory distress.  Skin:    Findings: No rash.  Neurological:     Mental Status: He is alert.  Psychiatric:        Mood and Affect: Mood normal.    ED Results / Procedures / Treatments   Labs (all labs ordered are listed, but only abnormal results are displayed) Labs Reviewed  URINALYSIS, ROUTINE W REFLEX MICROSCOPIC  GC/CHLAMYDIA PROBE AMP ([place]) NOT  AT [facility]    EKG None  Radiology No results found.  Procedures Procedures   Medications Ordered in ED Medications  metroNIDAZOLE (FLAGYL) tablet 2,000 mg (2,000 mg Oral Given 09/21/21 1700)    ED Course  I have reviewed the triage vital signs and the nursing notes.  Pertinent labs & imaging results that were available during my care of the patient were reviewed by me and considered in my medical decision making (see chart for details).    MDM Rules/Calculators/A&P Patient declined genitalia exam.  Has been treated with 2 g of Flagyl for trichomonas.  Denies need for other treatment.  Urinalysis and STI testing will be available in his online chart. Final Clinical Impression(s) / ED Diagnoses Final diagnoses:  STD exposure    Rx / DC Orders Results and diagnoses were explained to the patient. Return precautions discussed in full. Patient had no additional questions and expressed complete understanding.     09/23/21 09/21/21 09/23/21, MD 09/21/21 2006

## 2021-09-21 NOTE — ED Triage Notes (Signed)
The pt reports that his sexual partner has been diagnosed  with tiih  hes here to be tested for the same  he just found out today

## 2021-09-21 NOTE — Discharge Instructions (Signed)
Please do not drink alcohol today or tomorrow due to your antibiotic. Results will be in your chart.

## 2021-09-22 LAB — GC/CHLAMYDIA PROBE AMP (~~LOC~~) NOT AT ARMC
Chlamydia: POSITIVE — AB
Comment: NEGATIVE
Comment: NORMAL
Neisseria Gonorrhea: NEGATIVE

## 2021-09-23 ENCOUNTER — Other Ambulatory Visit: Payer: Self-pay

## 2021-09-23 ENCOUNTER — Emergency Department (HOSPITAL_COMMUNITY)
Admission: EM | Admit: 2021-09-23 | Discharge: 2021-09-23 | Disposition: A | Discharge status: Home / Self Care | Payer: Self-pay | Attending: Emergency Medicine | Admitting: Emergency Medicine

## 2021-09-23 ENCOUNTER — Encounter (HOSPITAL_COMMUNITY): Payer: Self-pay | Admitting: Emergency Medicine

## 2021-09-23 DIAGNOSIS — F17210 Nicotine dependence, cigarettes, uncomplicated: Secondary | ICD-10-CM | POA: Insufficient documentation

## 2021-09-23 DIAGNOSIS — A749 Chlamydial infection, unspecified: Secondary | ICD-10-CM

## 2021-09-23 DIAGNOSIS — A493 Mycoplasma infection, unspecified site: Secondary | ICD-10-CM | POA: Insufficient documentation

## 2021-09-23 DIAGNOSIS — A568 Sexually transmitted chlamydial infection of other sites: Secondary | ICD-10-CM | POA: Insufficient documentation

## 2021-09-23 LAB — URINALYSIS, ROUTINE W REFLEX MICROSCOPIC
Bilirubin Urine: NEGATIVE
Glucose, UA: NEGATIVE mg/dL
Hgb urine dipstick: NEGATIVE
Ketones, ur: NEGATIVE mg/dL
Nitrite: NEGATIVE
Protein, ur: NEGATIVE mg/dL
Specific Gravity, Urine: 1.025 (ref 1.005–1.030)
pH: 5 (ref 5.0–8.0)

## 2021-09-23 MED ORDER — DOXYCYCLINE HYCLATE 100 MG PO CAPS: 100.0000 mg | ORAL_CAPSULE | Freq: Two times a day (BID) | ORAL | 0 refills | Status: DC

## 2021-09-23 MED ORDER — MOXIFLOXACIN HCL 400 MG PO TABS: 400.0000 mg | ORAL_TABLET | Freq: Every day | ORAL | 0 refills | Status: DC

## 2021-09-23 NOTE — ED Triage Notes (Signed)
Pt. Stated, I have a STD and need to be treated. I have my results

## 2021-09-23 NOTE — ED Provider Notes (Signed)
[facility] EMERGENCY DEPARTMENT Provider Note   CSN: [id number] Arrival date & time: 09/23/21  7543     History Chief Complaint  Patient presents with   Dysuria    Steven Deleon is a 25 y.o. male.  HPI Patient reports he is here because his girlfriend reports that she tested positive for STI.  He reports that she has tested positive for mycoplasma genitalium.  Also, he was seen 2 days ago and at that time treated for trichomonas.  His tests have yielded positive result for chlamydia.  Patient reports sometimes he has some burning with urination.  He is mostly asymptomatic.  He does not have fever or chills penile drainage.    No past medical history on file.  There are no problems to display for this patient.   Past Surgical History:  Procedure Laterality Date   APPENDECTOMY         No family history on file.  Social History   Tobacco Use   Smoking status: Every Day    Packs/day: 0.50    Types: Cigarettes    Passive exposure: Past   Smokeless tobacco: Never  Substance Use Topics   Alcohol use: Yes   Drug use: Yes    Types: Marijuana    Home Medications Prior to Admission medications   Medication Sig Start Date End Date Taking? Authorizing Provider  doxycycline (VIBRAMYCIN) 100 MG capsule Take 1 capsule (100 mg total) by mouth 2 (two) times daily. One po bid x 7 days 09/23/21  Yes Caison Hearn, Lebron Conners, MD  moxifloxacin (AVELOX) 400 MG tablet Take 1 tablet (400 mg total) by mouth daily at 8 pm. 09/23/21  Yes Arby Barrette, MD    Allergies    Patient has no known allergies.  Review of Systems   Review of Systems Constitutional: No fever no chills no malaise GI: No abdominal pain vomiting or diarrhea. ENT: No sore throat no nasal congestion Physical Exam Updated Vital Signs BP (!) 165/90 (BP Location: Right Arm)   Pulse 80   Temp 97.8 F (36.6 C)   Resp 16   SpO2 98%   Physical Exam Constitutional:      Comments: Alert  nontoxic well in appearance.  HENT:     Head: Normocephalic and atraumatic.     Mouth/Throat:     Pharynx: Oropharynx is clear.  Eyes:     Extraocular Movements: Extraocular movements intact.  Cardiovascular:     Rate and Rhythm: Normal rate and regular rhythm.  Pulmonary:     Effort: Pulmonary effort is normal.     Breath sounds: Normal breath sounds.  Abdominal:     General: There is no distension.     Palpations: Abdomen is soft.     Tenderness: There is no abdominal tenderness. There is no guarding.  Skin:    General: Skin is warm and dry.  Neurological:     General: No focal deficit present.     Mental Status: He is oriented to person, place, and time.  Psychiatric:        Mood and Affect: Mood normal.    ED Results / Procedures / Treatments   Labs (all labs ordered are listed, but only abnormal results are displayed) Labs Reviewed  URINALYSIS, ROUTINE W REFLEX MICROSCOPIC - Abnormal; Notable for the following components:      Result Value   Leukocytes,Ua TRACE (*)    Bacteria, UA RARE (*)    All other components within normal limits  EKG None  Radiology No results found.  Procedures Procedures   Medications Ordered in ED Medications - No data to display  ED Course  I have reviewed the triage vital signs and the nursing notes.  Pertinent labs & imaging results that were available during my care of the patient were reviewed by me and considered in my medical decision making (see chart for details).    MDM Rules/Calculators/A&P                           Patient had prior testing 2 days earlier that is now positive for chlamydia.  Patient is minimally symptomatic.  He also has information from his partner who reports her test is positive for mycoplasma genitalium.  This is not something we test for routinely.  At this time I will opt to treat for both these conditions as these are partners who would have the same exposure.  Review of diagnostic treatment  is for doxycycline 7 days followed by moxifloxacin for 7 days. 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

## 2021-09-23 NOTE — ED Triage Notes (Signed)
Pt. Stated, some burning with urination

## 2021-09-23 NOTE — ED Notes (Signed)
Pt stated that he was going to step outside to smoke.

## 2021-09-23 NOTE — Discharge Instructions (Signed)
1.  Your treatment for chlamydia is doxycycline 100 mg twice daily for a week.  This is also the treatment for mycoplasma genitalium but in addition to this you need to take an antibiotic called moxifloxacin for an additional week.  That antibiotic starts after you complete the doxycycline.

## 2022-09-01 LAB — GC/CHLAMYDIA PROBE AMP
Chlamydia trachomatis, NAA: NEGATIVE
Neisseria Gonorrhoeae by PCR: NEGATIVE

## 2022-11-07 ENCOUNTER — Encounter (HOSPITAL_COMMUNITY): Payer: Self-pay | Admitting: *Deleted

## 2022-11-07 ENCOUNTER — Other Ambulatory Visit: Payer: Self-pay

## 2022-11-07 ENCOUNTER — Ambulatory Visit (HOSPITAL_COMMUNITY)
Admission: EM | Admit: 2022-11-07 | Discharge: 2022-11-07 | Disposition: A | Discharge status: Home / Self Care | Payer: Self-pay | Attending: Internal Medicine | Admitting: Internal Medicine

## 2022-11-07 ENCOUNTER — Emergency Department (HOSPITAL_COMMUNITY): Admission: EM | Admit: 2022-11-07 | Discharge: 2022-11-07 | Discharge status: Against Medical Advice | Payer: Self-pay

## 2022-11-07 DIAGNOSIS — Z1152 Encounter for screening for COVID-19: Secondary | ICD-10-CM | POA: Insufficient documentation

## 2022-11-07 DIAGNOSIS — R059 Cough, unspecified: Secondary | ICD-10-CM | POA: Insufficient documentation

## 2022-11-07 DIAGNOSIS — Z79899 Other long term (current) drug therapy: Secondary | ICD-10-CM | POA: Insufficient documentation

## 2022-11-07 DIAGNOSIS — R6883 Chills (without fever): Secondary | ICD-10-CM

## 2022-11-07 DIAGNOSIS — J069 Acute upper respiratory infection, unspecified: Secondary | ICD-10-CM

## 2022-11-07 MED ORDER — IBUPROFEN 800 MG PO TABS: 800.0000 mg | ORAL_TABLET | Freq: Three times a day (TID) | ORAL | 0 refills | Status: DC

## 2022-11-07 MED ORDER — GUAIFENESIN ER 1200 MG PO TB12: 1200.0000 mg | ORAL_TABLET | Freq: Two times a day (BID) | ORAL | 0 refills | Status: DC

## 2022-11-07 MED ORDER — IBUPROFEN 800 MG PO TABS
800.0000 mg | ORAL_TABLET | Freq: Once | ORAL | Status: AC
  Administered 2022-11-07: 800 mg via ORAL

## 2022-11-07 MED ORDER — BENZONATATE 100 MG PO CAPS: 100.0000 mg | ORAL_CAPSULE | Freq: Three times a day (TID) | ORAL | 0 refills | Status: DC

## 2022-11-07 MED ORDER — IBUPROFEN 800 MG PO TABS
ORAL_TABLET | ORAL | Status: AC
  Filled 2022-11-07: qty 1

## 2022-11-07 NOTE — Discharge Instructions (Addendum)
You have a viral upper respiratory infection.  COVID-19 testing is pending. We will call you with results if positive. If your COVID test is positive, you must stay at home until day 6 of symptoms. On day 6, you may go out into public and go back to work, but you must wear a mask until day 11 of symptoms to prevent spread to others.  Purchase mucinex (guaifenesin) 1200mg  and take this every 12 hours for the next few days to thin your nasal congestion and mucous so that you are able to get out of your body easier by coughing and blowing your nose. Drink plenty of water while taking this.  Take tessalon pearles every 8 hours as needed for cough.  You may take tylenol 1,000mg  and ibuprofen 800mg  every 8 hours with food as needed for fever/chills, sore throat, aches/pains, and inflammation associated with viral illness. Take this with food to avoid stomach upset.    You may do salt water and baking soda gargles every 4 hours as needed for your throat pain.  Please put 1 teaspoon of salt and 1/2 teaspoon of baking soda in 8 ounces of warm water then gargle and spit the water out. You may also put 1 tablespoon of honey in warm water and drink this to soothe your throat.  Place a humidifier in your room at night to help decrease dry air that can irritate your airway and cause you to have a sore throat and cough.  Please try to eat a well-balanced diet while you are sick so that your body gets proper nutrition to heal.  If you develop any new or worsening symptoms, please return.  If your symptoms are severe, please go to the emergency room.  Follow-up with your primary care provider for further evaluation and management of your symptoms as well as ongoing wellness visits.  I hope you feel better!

## 2022-11-07 NOTE — ED Triage Notes (Signed)
Pt reports since Sat he has had generlized body pain,fever,ear pain .

## 2022-11-07 NOTE — ED Notes (Signed)
Patient called x3 for triage, no answer.

## 2022-11-07 NOTE — ED Provider Notes (Signed)
[facility]    CSN: [id number] Arrival date & time: 11/07/22  0935      History   Chief Complaint Chief Complaint  Patient presents with   Fever   Nasal Congestion   Otalgia    HPI Steven Deleon is a 27 y.o. male.   Patient presents urgent care for evaluation of fever, chills, nasal congestion, ear pain bilaterally, cough, and generalized body aches/weakness that started on Saturday, December 30th 2023 (3 days ago).  He has not checked his temperature at home but reports significant chills and diaphoresis.  Denies sore throat, headache, dizziness, nausea, vomiting, abdominal pain, diarrhea, shortness of breath, chest pain, heart palpitations, and neck pain.  Reports reduced appetite but tolerating food and fluids well without difficulty.  No recent antibiotic use or steroid use.  He is a smoker but denies other drug use.  Denies history of asthma and chronic respiratory problems.  He has been using liquid Mucinex intermittently over the last few days without much relief of symptoms.  He has not had any antipyretic medications this morning, temperature is currently 100.2.  No known sick contacts with similar symptoms but he does work in a warehouse and he is not sure if he was exposed to a viral illness at work.   Fever Associated symptoms: ear pain   Otalgia Associated symptoms: fever     History reviewed. No pertinent past medical history.  There are no problems to display for this patient.   Past Surgical History:  Procedure Laterality Date   APPENDECTOMY         Home Medications    Prior to Admission medications   Medication Sig Start Date End Date Taking? Authorizing Provider  benzonatate (TESSALON) 100 MG capsule Take 1 capsule (100 mg total) by mouth every 8 (eight) hours. 11/07/22  Yes Talbot Grumbling, FNP  Guaifenesin 1200 MG TB12 Take 1 tablet (1,200 mg total) by mouth in the morning and at bedtime. 11/07/22  Yes Talbot Grumbling,  FNP  ibuprofen (ADVIL) 800 MG tablet Take 1 tablet (800 mg total) by mouth 3 (three) times daily. 11/07/22  Yes Talbot Grumbling, FNP  doxycycline (VIBRAMYCIN) 100 MG capsule Take 1 capsule (100 mg total) by mouth 2 (two) times daily. One po bid x 7 days 09/23/21   Charlesetta Shanks, MD  moxifloxacin (AVELOX) 400 MG tablet Take 1 tablet (400 mg total) by mouth daily at 8 pm. 09/23/21   Charlesetta Shanks, MD    Family History History reviewed. No pertinent family history.  Social History Social History   Tobacco Use   Smoking status: Every Day    Packs/day: 0.50    Types: Cigarettes    Passive exposure: Past   Smokeless tobacco: Never  Substance Use Topics   Alcohol use: Yes   Drug use: Yes    Types: Marijuana     Allergies   Patient has no known allergies.   Review of Systems Review of Systems  Constitutional:  Positive for fever.  HENT:  Positive for ear pain.   Per HPI   Physical Exam Triage Vital Signs ED Triage Vitals  Enc Vitals Group     BP 11/07/22 1240 (!) 171/109     Pulse Rate 11/07/22 1240 (!) 107     Resp 11/07/22 1240 18     Temp 11/07/22 1240 100.2 F (37.9 C)     Temp src --      SpO2 11/07/22 1240 95 %  Weight --      Height --      Head Circumference --      Peak Flow --      Pain Score 11/07/22 1236 6     Pain Loc --      Pain Edu? --      Excl. in GC? --    No data found.  Updated Vital Signs BP (!) 171/109   Pulse (!) 107   Temp 100.2 F (37.9 C)   Resp 18   SpO2 95%   Visual Acuity Right Eye Distance:   Left Eye Distance:   Bilateral Distance:    Right Eye Near:   Left Eye Near:    Bilateral Near:     Physical Exam Vitals and nursing note reviewed.  Constitutional:      Appearance: He is ill-appearing and diaphoretic. He is not toxic-appearing.  HENT:     Head: Normocephalic and atraumatic.     Right Ear: Hearing, tympanic membrane, ear canal and external ear normal.     Left Ear: Hearing, tympanic membrane, ear  canal and external ear normal.     Nose: Congestion present.     Mouth/Throat:     Lips: Pink.     Mouth: Mucous membranes are moist.     Pharynx: No oropharyngeal exudate or posterior oropharyngeal erythema.  Eyes:     General: Lids are normal. Vision grossly intact. Gaze aligned appropriately.        Right eye: No discharge.        Left eye: No discharge.     Extraocular Movements: Extraocular movements intact.     Conjunctiva/sclera: Conjunctivae normal.     Pupils: Pupils are equal, round, and reactive to light.  Cardiovascular:     Rate and Rhythm: Normal rate and regular rhythm.     Heart sounds: Normal heart sounds, S1 normal and S2 normal.  Pulmonary:     Effort: Pulmonary effort is normal. No respiratory distress.     Breath sounds: Normal breath sounds and air entry. No stridor. No wheezing, rhonchi or rales.  Chest:     Chest wall: No tenderness.  Abdominal:     General: Abdomen is flat. Bowel sounds are normal.     Palpations: Abdomen is soft.     Tenderness: There is no abdominal tenderness.  Musculoskeletal:     Cervical back: Neck supple.  Lymphadenopathy:     Cervical: No cervical adenopathy.  Skin:    General: Skin is warm.     Capillary Refill: Capillary refill takes less than 2 seconds.     Findings: No rash.  Neurological:     General: No focal deficit present.     Mental Status: He is alert and oriented to person, place, and time. Mental status is at baseline.     Cranial Nerves: No dysarthria or facial asymmetry.     Motor: No weakness.     Gait: Gait normal.  Psychiatric:        Mood and Affect: Mood normal.        Speech: Speech normal.        Behavior: Behavior normal.        Thought Content: Thought content normal.        Judgment: Judgment normal.      UC Treatments / Results  Labs (all labs ordered are listed, but only abnormal results are displayed) Labs Reviewed  SARS CORONAVIRUS 2 (TAT 6-24 HRS)    EKG  Radiology No results  found.  Procedures Procedures (including critical care time)  Medications Ordered in UC Medications  ibuprofen (ADVIL) tablet 800 mg (has no administration in time range)    Initial Impression / Assessment and Plan / UC Course  I have reviewed the triage vital signs and the nursing notes.  Pertinent labs & imaging results that were available during my care of the patient were reviewed by me and considered in my medical decision making (see chart for details).   Viral URI with cough Symptoms and physical exam consistent with a viral upper respiratory tract infection that will likely resolve with rest, fluids, and prescriptions for symptomatic relief. No indication for imaging today based on stable cardiopulmonary exam and hemodynamically stable vital signs.  COVID-19 PCR testing is pending.  We will call patient if this is positive.  Quarantine guidelines discussed. Currently on day 4 of symptoms and does not qualify for antiviral therapy.  Deferred influenza testing based on timing of illness.  Patient given ibuprofen 800 mg in clinic today for body aches.  Tessalon Perles, guaifenesin, and ibuprofen 800 mg sent to pharmacy for symptomatic relief to be taken as prescribed.  May also take Tylenol 1000 mg every 6 hours as needed for fever, chills, and aches and pains. Nonpharmacologic interventions for symptom relief provided and after visit summary below.   Strict ED/urgent care return precautions given.  Patient verbalizes understanding and agreement with plan.  Counseled patient regarding possible side effects and uses of all medications prescribed at today's visit.  Patient verbalizes understanding and agreement with plan.  All questions answered.  Patient discharged from urgent care in stable condition.        Final Clinical Impressions(s) / UC Diagnoses   Final diagnoses:  Viral URI with cough  Chills     Discharge Instructions      You have a viral upper respiratory  infection.  COVID-19 testing is pending. We will call you with results if positive. If your COVID test is positive, you must stay at home until day 6 of symptoms. On day 6, you may go out into public and go back to work, but you must wear a mask until day 11 of symptoms to prevent spread to others.  Purchase mucinex (guaifenesin) 1200mg  and take this every 12 hours for the next few days to thin your nasal congestion and mucous so that you are able to get out of your body easier by coughing and blowing your nose. Drink plenty of water while taking this.  Take tessalon pearles every 8 hours as needed for cough.  You may take tylenol 1,000mg  and ibuprofen 800mg  every 8 hours with food as needed for fever/chills, sore throat, aches/pains, and inflammation associated with viral illness. Take this with food to avoid stomach upset.    You may do salt water and baking soda gargles every 4 hours as needed for your throat pain.  Please put 1 teaspoon of salt and 1/2 teaspoon of baking soda in 8 ounces of warm water then gargle and spit the water out. You may also put 1 tablespoon of honey in warm water and drink this to soothe your throat.  Place a humidifier in your room at night to help decrease dry air that can irritate your airway and cause you to have a sore throat and cough.  Please try to eat a well-balanced diet while you are sick so that your body gets proper nutrition to heal.  If you develop any  new or worsening symptoms, please return.  If your symptoms are severe, please go to the emergency room.  Follow-up with your primary care provider for further evaluation and management of your symptoms as well as ongoing wellness visits.  I hope you feel better!     ED Prescriptions     Medication Sig Dispense Auth. Provider   Guaifenesin 1200 MG TB12 Take 1 tablet (1,200 mg total) by mouth in the morning and at bedtime. 14 tablet Joella Prince M, FNP   benzonatate (TESSALON) 100 MG capsule Take 1  capsule (100 mg total) by mouth every 8 (eight) hours. 21 capsule Joella Prince M, FNP   ibuprofen (ADVIL) 800 MG tablet Take 1 tablet (800 mg total) by mouth 3 (three) times daily. 21 tablet Talbot Grumbling, FNP      PDMP not reviewed this encounter.   Talbot Grumbling, [place] 11/07/22 1256

## 2022-11-08 LAB — SARS CORONAVIRUS 2 (TAT 6-24 HRS): SARS Coronavirus 2: NEGATIVE

## 2023-01-14 ENCOUNTER — Emergency Department (HOSPITAL_COMMUNITY)
Admission: EM | Admit: 2023-01-14 | Discharge: 2023-01-14 | Disposition: A | Discharge status: Home / Self Care | Payer: Self-pay | Attending: Emergency Medicine | Admitting: Emergency Medicine

## 2023-01-14 ENCOUNTER — Other Ambulatory Visit: Payer: Self-pay

## 2023-01-14 ENCOUNTER — Encounter (HOSPITAL_COMMUNITY): Payer: Self-pay | Admitting: *Deleted

## 2023-01-14 DIAGNOSIS — R1033 Periumbilical pain: Secondary | ICD-10-CM | POA: Insufficient documentation

## 2023-01-14 DIAGNOSIS — Z711 Person with feared health complaint in whom no diagnosis is made: Secondary | ICD-10-CM

## 2023-01-14 DIAGNOSIS — Z202 Contact with and (suspected) exposure to infections with a predominantly sexual mode of transmission: Secondary | ICD-10-CM | POA: Insufficient documentation

## 2023-01-14 DIAGNOSIS — R369 Urethral discharge, unspecified: Secondary | ICD-10-CM | POA: Insufficient documentation

## 2023-01-14 DIAGNOSIS — R1012 Left upper quadrant pain: Secondary | ICD-10-CM | POA: Insufficient documentation

## 2023-01-14 DIAGNOSIS — R1031 Right lower quadrant pain: Secondary | ICD-10-CM | POA: Insufficient documentation

## 2023-01-14 LAB — URINALYSIS, ROUTINE W REFLEX MICROSCOPIC
Bacteria, UA: NONE SEEN
Bilirubin Urine: NEGATIVE
Glucose, UA: NEGATIVE mg/dL
Hgb urine dipstick: NEGATIVE
Ketones, ur: NEGATIVE mg/dL
Nitrite: NEGATIVE
Protein, ur: NEGATIVE mg/dL
Specific Gravity, Urine: 1.027 (ref 1.005–1.030)
WBC, UA: >50 WBC/hpf (ref 0–5)
pH: 5 (ref 5.0–8.0)

## 2023-01-14 MED ORDER — CEFTRIAXONE SODIUM 500 MG IJ SOLR
500.0000 mg | Freq: Once | INTRAMUSCULAR | Status: AC
  Administered 2023-01-14: 500 mg via INTRAMUSCULAR
  Filled 2023-01-14: qty 500

## 2023-01-14 MED ORDER — DOXYCYCLINE HYCLATE 100 MG PO TABS
100.0000 mg | ORAL_TABLET | Freq: Once | ORAL | Status: AC
  Administered 2023-01-14: 100 mg via ORAL
  Filled 2023-01-14: qty 1

## 2023-01-14 MED ORDER — DOXYCYCLINE HYCLATE 100 MG PO CAPS: 100.0000 mg | ORAL_CAPSULE | Freq: Two times a day (BID) | ORAL | 0 refills | Status: AC

## 2023-01-14 MED ORDER — LIDOCAINE HCL (PF) 1 % IJ SOLN
INTRAMUSCULAR | Status: AC
  Administered 2023-01-14: 1.5 mL via INTRAMUSCULAR
  Filled 2023-01-14: qty 5

## 2023-01-14 NOTE — ED Triage Notes (Signed)
Presents to ed via pov c/o right lower quad pain onset this am, also c/o penile discharge with penile discharge onset last pm states he has been having unprotected sex.

## 2023-01-14 NOTE — Discharge Instructions (Addendum)
Today your evaluated for penile discharge and concern for STI.  Today you were treated with a shot of ceftriaxone to presumptively treat gonorrhea.  You were also given doxycycline 100 mg tablet here in the ED to start your presumptive treatment for chlamydia.  Continue to take the doxycycline 100 mg twice daily for 7 days to complete chlamydia treatment.   We got a urine test to screen for UTI, it showed no signs of urine infection.  We will call you with the results of your urine test for gonorrhea, chlamydia, and trichomonas.  You declined blood work today for HIV, syphilis, and hepatitis C.  You may have these done at any primary care office, health department, or urgent care if you become concerned about these in the future.

## 2023-01-14 NOTE — ED Provider Notes (Signed)
[facility] Provider Note  CSN: [id number] Arrival date & time: 01/14/23  1010  History  Chief Complaint  Patient presents with   Penile Discharge   Steven Deleon is a 27 y.o. male.  27 year old male with past medical history of gonorrhea, chlamydia, trichomonas presents with new onset penile discharge and dysuria.  Also some RLQ abdominal pain. He reports he is quite sure this is an STI like gonorrhea or chlamydia and he requests testing and treatment.  Denies fever, chills, nausea, vomiting, diarrhea.  Normal p.o. intake and UOP.  He reports that when he has STI Chlamydia gonorrhea, he normally has trouble with constipation.  Denies any lesions or skin changes.  Reports unprotected intercourse.  No history of nephrolithiasis, he reports "I know this is not a kidney stone".    Home Medications Prior to Admission medications   Medication Sig Start Date End Date Taking? Authorizing Provider  doxycycline (VIBRAMYCIN) 100 MG capsule Take 1 capsule (100 mg total) by mouth 2 (two) times daily for 7 days. Start evening of 3/10. Continue twice daily until finished. 01/14/23 01/21/23 Yes Ezequiel Essex, MD     Allergies    Patient has no known allergies.    Review of Systems   Review of Systems  Constitutional:  Negative for activity change, appetite change, chills and diaphoresis.  Gastrointestinal:  Positive for abdominal pain. Negative for abdominal distention, anal bleeding, blood in stool, diarrhea, nausea and vomiting.  Genitourinary:  Positive for dysuria, frequency and penile discharge. Negative for flank pain and hematuria.   Physical Exam Updated Vital Signs BP (!) 162/103   Pulse 75   Temp 98.2 F (36.8 C) (Oral)   Resp 14   Ht '5\' 9"'$  (1.753 m)   Wt 81.6 kg   SpO2 96%   BMI 26.58 kg/m  Physical Exam Vitals and nursing note reviewed.  Constitutional:      General: He is not in acute distress.    Appearance: Normal  appearance. He is normal weight. He is not ill-appearing, toxic-appearing or diaphoretic.  Cardiovascular:     Rate and Rhythm: Normal rate and regular rhythm.     Pulses: Normal pulses.     Heart sounds: Normal heart sounds.  Pulmonary:     Effort: Pulmonary effort is normal.     Breath sounds: Normal breath sounds.  Abdominal:     General: Abdomen is flat. Bowel sounds are normal. There is no distension.     Palpations: Abdomen is soft.     Tenderness: There is abdominal tenderness in the periumbilical area and left upper quadrant. There is no guarding or rebound.  Skin:    General: Skin is warm.  Neurological:     General: No focal deficit present.     Mental Status: He is alert and oriented to person, place, and time.    ED Results / Procedures / Treatments   Labs (all labs ordered are listed, but only abnormal results are displayed) Labs Reviewed  URINALYSIS, ROUTINE W REFLEX MICROSCOPIC - Abnormal; Notable for the following components:      Result Value   APPearance HAZY (*)    Leukocytes,Ua MODERATE (*)    All other components within normal limits  URINE CYTOLOGY ANCILLARY ONLY   EKG None  Radiology No results found.  Procedures Procedures   Medications Ordered in ED Medications  cefTRIAXone (ROCEPHIN) injection 500 mg (500 mg Intramuscular Given 01/14/23 1107)  doxycycline (VIBRA-TABS) tablet 100 mg (  100 mg Oral Given 01/14/23 1051)  lidocaine (PF) (XYLOCAINE) 1 % injection (1.5 mLs Intramuscular Given 01/14/23 1108)   ED Course/ Medical Decision Making/ A&P  Medical Decision Making 27 year old man with PMH gonorrhea, chlamydia, and trichomonas presents today with penile discharge, dysuria, and abdominal pain.  He is most concerned for an STI, requests testing and treatment.  He declines blood work for HIV, RPR, and hep C stating "I am not worried about those, I can get a clinic appointment to check that".  He also declines physical exam of the genital area.  History of appendectomy rules out appendicitis at this time.  Will obtain UA to screen for UTI.  Will presumptively treat gonorrhea and chlamydia with ceftriaxone IM 500 mg x 1 and doxycycline 100 mg twice daily x 7 days.  11:10 AM: UA without evidence of UTI.  Will treat presumptively for gonorrhea and chlamydia as described above in discharge.  Prescription for doxycycline sent to pharmacy.  Amount and/or Complexity of Data Reviewed Labs: ordered. Decision-making details documented in ED Course.    Details: Urine cytology for gonorrhea, chlamydia, and trichomonas.  UA for dysuria.  Risk Prescription drug management.   Final Clinical Impression(s) / ED Diagnoses Final diagnoses:  Concern about STD in male without diagnosis  Penile discharge   Rx / DC Orders ED Discharge Orders          Ordered    doxycycline (VIBRAMYCIN) 100 MG capsule  2 times daily        01/14/23 1035           Ezequiel Essex, MD   Ezequiel Essex, MD 01/14/23 1111    Tegeler, Gwenyth Allegra, MD 01/16/23 1517

## 2023-08-06 ENCOUNTER — Emergency Department (HOSPITAL_COMMUNITY)
Admission: EM | Admit: 2023-08-06 | Discharge: 2023-08-06 | Disposition: A | Discharge status: Home / Self Care | Payer: Medicaid Other | Attending: Emergency Medicine | Admitting: Emergency Medicine

## 2023-08-06 ENCOUNTER — Other Ambulatory Visit: Payer: Self-pay

## 2023-08-06 DIAGNOSIS — R369 Urethral discharge, unspecified: Secondary | ICD-10-CM | POA: Diagnosis present

## 2023-08-06 DIAGNOSIS — J069 Acute upper respiratory infection, unspecified: Secondary | ICD-10-CM | POA: Diagnosis not present

## 2023-08-06 LAB — URINALYSIS, ROUTINE W REFLEX MICROSCOPIC
Bilirubin Urine: NEGATIVE
Glucose, UA: NEGATIVE mg/dL
Hgb urine dipstick: NEGATIVE
Ketones, ur: NEGATIVE mg/dL
Nitrite: NEGATIVE
Protein, ur: NEGATIVE mg/dL
Specific Gravity, Urine: 1.021 (ref 1.005–1.030)
WBC, UA: >50 WBC/hpf (ref 0–5)
pH: 5 (ref 5.0–8.0)

## 2023-08-06 MED ORDER — AZITHROMYCIN 250 MG PO TABS
1000.0000 mg | ORAL_TABLET | Freq: Once | ORAL | Status: AC
  Administered 2023-08-06: 1000 mg via ORAL
  Filled 2023-08-06: qty 4

## 2023-08-06 MED ORDER — CEFTRIAXONE SODIUM 500 MG IJ SOLR
500.0000 mg | Freq: Once | INTRAMUSCULAR | Status: AC
  Administered 2023-08-06: 500 mg via INTRAMUSCULAR
  Filled 2023-08-06: qty 500

## 2023-08-06 MED ORDER — METRONIDAZOLE 500 MG PO TABS
2000.0000 mg | ORAL_TABLET | Freq: Once | ORAL | Status: AC
  Administered 2023-08-06: 2000 mg via ORAL
  Filled 2023-08-06: qty 4

## 2023-08-06 NOTE — ED Provider Notes (Signed)
[place] EMERGENCY DEPARTMENT AT [facility] Provider Note   CSN: [id number] Arrival date & time: 08/06/23  4098     History  Chief Complaint  Patient presents with   Otalgia / Penile Discharge    Steven Deleon is a 27 y.o. male.  27 yo M with a chief complaint of penile discharge and bilateral ear pain.  This has been going on for about a week as far as the ear pain and he started having penile discharge this morning.  He denies any injury to his penis denies pain to the penis or testicles.  Denies rash.  Denies swelling.  He feels like he has had a little bit of pain in his right side and right low back.  He also has been complaining of bilateral ear pain.  Has had a little bit of congestion going on for the past week.  Is been trying to clean his ears out with Q-tips.  Has not had much improvement.        Home Medications Prior to Admission medications   Not on File      Allergies    Patient has no known allergies.    Review of Systems   Review of Systems  Physical Exam Updated Vital Signs BP (!) 161/94 (BP Location: Right Arm)   Pulse 80   Temp 97.7 F (36.5 C)   Resp 16   SpO2 99%  Physical Exam Vitals and nursing note reviewed.  Constitutional:      Appearance: He is well-developed.  HENT:     Head: Normocephalic and atraumatic.     Comments: Swollen turbinates, posterior nasal drip, no noted sinus ttp, small serous effusion to bilateral TMs  Eyes:     Pupils: Pupils are equal, round, and reactive to light.  Neck:     Vascular: No JVD.  Cardiovascular:     Rate and Rhythm: Normal rate and regular rhythm.     Heart sounds: No murmur heard.    No friction rub. No gallop.  Pulmonary:     Effort: No respiratory distress.     Breath sounds: No wheezing.  Abdominal:     General: There is no distension.     Tenderness: There is no abdominal tenderness. There is no guarding or rebound.  Genitourinary:    Comments: Patient declined  GU exam Musculoskeletal:        General: Normal range of motion.     Cervical back: Normal range of motion and neck supple.  Skin:    Coloration: Skin is not pale.     Findings: No rash.  Neurological:     Mental Status: He is alert and oriented to person, place, and time.  Psychiatric:        Behavior: Behavior normal.     ED Results / Procedures / Treatments   Labs (all labs ordered are listed, but only abnormal results are displayed) Labs Reviewed  URINALYSIS, ROUTINE W REFLEX MICROSCOPIC - Abnormal; Notable for the following components:      Result Value   APPearance HAZY (*)    Leukocytes,Ua MODERATE (*)    Bacteria, UA RARE (*)    All other components within normal limits  GC/CHLAMYDIA PROBE AMP ([place]) NOT AT [facility]    EKG None  Radiology No results found.  Procedures Procedures    Medications Ordered in ED Medications  cefTRIAXone (ROCEPHIN) injection 500 mg (has no administration in time range)  azithromycin (ZITHROMAX) tablet 1,000 mg (  has no administration in time range)  metroNIDAZOLE (FLAGYL) tablet 2,000 mg (has no administration in time range)    ED Course/ Medical Decision Making/ A&P                                 Medical Decision Making Amount and/or Complexity of Data Reviewed Labs: ordered.  Risk Prescription drug management.   27 yo M with 2 complaints.  He is here for penile discharge started this morning as well as bilateral ear pain has been going on the past week.  Patient deferred GU exam.  Patient is also deferring HIV and syphilis testing.  Will treat presumptively for gonorrhea and chlamydia.  Have him follow-up with the health department.  Patient has signs symptoms consistent with a URI.  No obvious otitis media on exam.  No other bacterial source was found.  Treat supportively.  PCP follow-up.  8:28 AM:  I have discussed the diagnosis/risks/treatment options with the patient.  Evaluation and diagnostic testing in the  emergency department does not suggest an emergent condition requiring admission or immediate intervention beyond what has been performed at this time.  They will follow up with PCP. We also discussed returning to the ED immediately if new or worsening sx occur. We discussed the sx which are most concerning (e.g., sudden worsening pain, fever, inability to tolerate by mouth) that necessitate immediate return. Medications administered to the patient during their visit and any new prescriptions provided to the patient are listed below.  Medications given during this visit Medications  cefTRIAXone (ROCEPHIN) injection 500 mg (has no administration in time range)  azithromycin (ZITHROMAX) tablet 1,000 mg (has no administration in time range)  metroNIDAZOLE (FLAGYL) tablet 2,000 mg (has no administration in time range)     The patient appears reasonably screen and/or stabilized for discharge and I doubt any other medical condition or other [facility] requiring further screening, evaluation, or treatment in the ED at this time prior to discharge.          Final Clinical Impression(s) / ED Diagnoses Final diagnoses:  URI, acute  Penile discharge    Rx / DC Orders ED Discharge Orders     None         Melene Plan, DO 08/06/23 6578

## 2023-08-06 NOTE — ED Triage Notes (Signed)
Patient reports right ear ache this week . No drainage , also requesting STD screening states whitish penile discharge with dysuria this morning .

## 2023-08-06 NOTE — Discharge Instructions (Addendum)
Take tylenol 2 pills 4 times a day and motrin 4 pills 3 times a day.  Drink plenty of fluids.  Return for worsening shortness of breath, headache, confusion. Follow up with your family doctor.   I am treating you for the common sexually transmitted diseases.  You need to let your partners up to 90 days before you start having symptoms know so they could also be tested and treated.

## 2023-08-08 LAB — GC/CHLAMYDIA PROBE AMP (~~LOC~~) NOT AT ARMC
Chlamydia: NEGATIVE
Comment: NEGATIVE
Comment: NORMAL
Neisseria Gonorrhea: POSITIVE — AB

## 2024-08-26 ENCOUNTER — Emergency Department (HOSPITAL_COMMUNITY)
Admission: EM | Admit: 2024-08-26 | Discharge: 2024-08-26 | Disposition: A | Discharge status: Home / Self Care | Attending: Emergency Medicine | Admitting: Emergency Medicine

## 2024-08-26 ENCOUNTER — Encounter (HOSPITAL_COMMUNITY): Payer: Self-pay

## 2024-08-26 ENCOUNTER — Other Ambulatory Visit: Payer: Self-pay

## 2024-08-26 DIAGNOSIS — L739 Follicular disorder, unspecified: Secondary | ICD-10-CM | POA: Insufficient documentation

## 2024-08-26 DIAGNOSIS — Z113 Encounter for screening for infections with a predominantly sexual mode of transmission: Secondary | ICD-10-CM | POA: Diagnosis not present

## 2024-08-26 LAB — URINALYSIS, ROUTINE W REFLEX MICROSCOPIC
Bilirubin Urine: NEGATIVE
Glucose, UA: NEGATIVE mg/dL
Hgb urine dipstick: NEGATIVE
Ketones, ur: NEGATIVE mg/dL
Leukocytes,Ua: NEGATIVE
Nitrite: NEGATIVE
Protein, ur: NEGATIVE mg/dL
Specific Gravity, Urine: 1.021 (ref 1.005–1.030)
pH: 5 (ref 5.0–8.0)

## 2024-08-26 MED ORDER — DOXYCYCLINE HYCLATE 100 MG PO CAPS: 100.0000 mg | ORAL_CAPSULE | Freq: Two times a day (BID) | ORAL | 0 refills | Status: DC

## 2024-08-26 MED ORDER — DOXYCYCLINE HYCLATE 100 MG PO TABS
100.0000 mg | ORAL_TABLET | Freq: Once | ORAL | Status: AC
  Administered 2024-08-26: 100 mg via ORAL
  Filled 2024-08-26: qty 1

## 2024-08-26 MED ORDER — CEFTRIAXONE SODIUM 500 MG IJ SOLR
500.0000 mg | Freq: Once | INTRAMUSCULAR | Status: AC
  Administered 2024-08-26: 500 mg via INTRAMUSCULAR
  Filled 2024-08-26: qty 500

## 2024-08-26 MED ORDER — LIDOCAINE HCL (PF) 1 % IJ SOLN
INTRAMUSCULAR | Status: AC
  Administered 2024-08-26: 5 mL
  Filled 2024-08-26: qty 30

## 2024-08-26 MED ORDER — LIDOCAINE HCL (PF) 1 % IJ SOLN
INTRAMUSCULAR | Status: AC
  Filled 2024-08-26: qty 5

## 2024-08-26 NOTE — ED Triage Notes (Signed)
 Pt concerned for STD, pt c.o lower back pain and possible penile discharge. States similar symptoms when he has had one in the past. Pt also has a bump on his neck he wants looked at. Pt has a lot of facial hair. Denies pain to the area.

## 2024-08-26 NOTE — Discharge Instructions (Addendum)
 You have evidence of folliculitis on your neck for which we will treat with doxycycline  which is the same medicine we use to cover empirically for chlamydia.  Follow-up with your primary care doctor for continued outpatient management.  Your gonorrhea and Chlamydia test results will be available on the patient portal.

## 2024-08-26 NOTE — ED Provider Notes (Signed)
 [place] EMERGENCY DEPARTMENT AT [facility] Provider Note   CSN: [id number] Arrival date & time: 08/26/24  9149     Patient presents with: SEXUALLY TRANSMITTED DISEASE and Abscess   Steven Deleon is a 28 y.o. male.    Abscess    28 year old male presenting to the emergency department with concern for need for STI check and treatment.  The patient has had unprotected intercourse and would like to be tested for STIs.  He said possible penile discharge, endorses some lower back discomfort.  States he had similar symptoms when he had a previous STI in the past.  Also he has an area on his neck that appears to be infected with a small amount of folliculitis present.  Denies any significant pain.  He has not been on antibiotic.  He declines testing for HIV and syphilis stating that he will follow-up with his primary care doctor for this.  Prior to Admission medications   Medication Sig Start Date End Date Taking? Authorizing Provider  doxycycline  (VIBRAMYCIN ) 100 MG capsule Take 1 capsule (100 mg total) by mouth 2 (two) times daily for 7 days. 08/26/24 09/02/24 Yes Jerrol Agent, MD    Allergies: Patient has no known allergies.    Review of Systems  All other systems reviewed and are negative.   Updated Vital Signs BP (!) 153/102 (BP Location: Right Arm)   Pulse 67   Temp 98.2 F (36.8 C) (Oral)   Resp 14   Ht 5' 10 (1.778 m)   SpO2 100%   BMI 25.83 kg/m   Physical Exam Vitals and nursing note reviewed.  Constitutional:      General: He is not in acute distress. HENT:     Head: Normocephalic and atraumatic.  Eyes:     Conjunctiva/sclera: Conjunctivae normal.     Pupils: Pupils are equal, round, and reactive to light.  Neck:     Comments: Single ingrown hair/folliculitis on the left anterior neck, no fluctuance palpated Cardiovascular:     Rate and Rhythm: Normal rate and regular rhythm.  Pulmonary:     Effort: Pulmonary effort is normal. No  respiratory distress.  Abdominal:     General: There is no distension.     Tenderness: There is no guarding.  Genitourinary:    Comments: Pt declined Musculoskeletal:        General: No deformity or signs of injury.     Cervical back: Neck supple.  Skin:    Findings: No lesion or rash.  Neurological:     General: No focal deficit present.     Mental Status: He is alert. Mental status is at baseline.     (all labs ordered are listed, but only abnormal results are displayed) Labs Reviewed  URINALYSIS, ROUTINE W REFLEX MICROSCOPIC  GC/CHLAMYDIA PROBE AMP ([place]) NOT AT [facility]    EKG: None  Radiology: No results found.   Procedures   Medications Ordered in the ED  cefTRIAXone  (ROCEPHIN ) injection 500 mg (has no administration in time range)  doxycycline  (VIBRA -TABS) tablet 100 mg (has no administration in time range)                                    Medical Decision Making Amount and/or Complexity of Data Reviewed Labs: ordered.    28 year old male presenting to the emergency department with concern for need for STI check and treatment.  The patient  has had unprotected intercourse and would like to be tested for STIs.  He said possible penile discharge, endorses some lower back discomfort.  States he had similar symptoms when he had a previous STI in the past.  Also he has an area on his neck that appears to be infected with a small amount of folliculitis present.  Denies any significant pain.  He has not been on antibiotic.  He declines testing for HIV and syphilis stating that he will follow-up with his primary care doctor for this.  On arrival, the patient was vitally stable.  Presenting with need for screening for STI and empiric treatment.  Declines testing for HIV and syphilis stating that he will do this with his primary care provider.  Also presents with an area of folliculitis about his beard along the neck, no palpable fluctuance to suggest abscess.  Area  mildly indurated around what appears to be an ingrown hair.  Patient declines GU exam.  Urinalysis negative for UTI.  Consents to empiric treatment for gonorrhea and chlamydia.  IM Rocephin  administered, will discharge with a course of doxycycline , advised close PCP follow-up with return precautions.     Final diagnoses:  Folliculitis  Screening examination for STI    ED Discharge Orders          Ordered    doxycycline  (VIBRAMYCIN ) 100 MG capsule  2 times daily        08/26/24 1047               Jerrol Agent, MD 08/26/24 1049

## 2024-08-27 ENCOUNTER — Telehealth: Payer: Self-pay

## 2024-08-27 ENCOUNTER — Telehealth (HOSPITAL_COMMUNITY): Payer: Self-pay | Admitting: Emergency Medicine

## 2024-08-27 LAB — GC/CHLAMYDIA PROBE AMP (~~LOC~~) NOT AT ARMC
Chlamydia: NEGATIVE
Comment: NEGATIVE
Comment: NORMAL
Neisseria Gonorrhea: NEGATIVE

## 2024-08-27 MED ORDER — DOXYCYCLINE HYCLATE 100 MG PO CAPS: 100.0000 mg | ORAL_CAPSULE | Freq: Two times a day (BID) | ORAL | 0 refills | Status: AC

## 2024-08-27 NOTE — Telephone Encounter (Signed)
 Pt called and stated that [facility] on [address] was having trouble with their system and didn't receive the electronic prescription. Pt requested that [facility] the [facility] on [address]. Provider notified and Rx resent. [facility] [address] Attempted to notify pt by phone, no answer x3 and not able to leave VM.

## 2024-08-27 NOTE — Telephone Encounter (Signed)
 I was informed by staff that the patient's doxycycline  prescription did not go through.  Prescription [facility] to [facility] [address].

## 2024-09-25 ENCOUNTER — Telehealth: Payer: Self-pay

## 2024-09-25 NOTE — Telephone Encounter (Signed)
 Patient was seen in October, he never picked up the doxycycline , message was sent to him October 22 by [facility].  He is requesting a refill or to renew the order for pick up as he is still having symptoms. Directed that he would have to be seen by a provider. The patient verbalized understanding.

## 2024-09-26 ENCOUNTER — Ambulatory Visit (HOSPITAL_COMMUNITY)
Admission: EM | Admit: 2024-09-26 | Discharge: 2024-09-26 | Disposition: A | Discharge status: Home / Self Care | Attending: Emergency Medicine | Admitting: Emergency Medicine

## 2024-09-26 ENCOUNTER — Encounter (HOSPITAL_COMMUNITY): Payer: Self-pay

## 2024-09-26 VITALS — BP 168/100 | HR 80 | Temp 98.5°F | Resp 16

## 2024-09-26 DIAGNOSIS — Z202 Contact with and (suspected) exposure to infections with a predominantly sexual mode of transmission: Secondary | ICD-10-CM | POA: Diagnosis present

## 2024-09-26 LAB — HIV ANTIBODY (ROUTINE TESTING W REFLEX): HIV Screen 4th Generation wRfx: NONREACTIVE

## 2024-09-26 MED ORDER — CEFTRIAXONE SODIUM 500 MG IJ SOLR
INTRAMUSCULAR | Status: AC
  Filled 2024-09-26: qty 500

## 2024-09-26 MED ORDER — LIDOCAINE HCL (PF) 1 % IJ SOLN
INTRAMUSCULAR | Status: AC
  Filled 2024-09-26: qty 2

## 2024-09-26 MED ORDER — CEFTRIAXONE SODIUM 500 MG IJ SOLR
500.0000 mg | Freq: Once | INTRAMUSCULAR | Status: AC
  Administered 2024-09-26: 500 mg via INTRAMUSCULAR

## 2024-09-26 NOTE — ED Triage Notes (Signed)
 Patient here today to be tested for Stds. Patient states that he was told that he was exposed to Gonorrhea. Patient denies symptoms but would still like to be treated.

## 2024-09-26 NOTE — Discharge Instructions (Signed)
 You will get a call if tests are positive or abnormal, you will not get a call if tests are negative  or normal but you can check results in MyChart if you have a MyChart account.

## 2024-09-26 NOTE — ED Provider Notes (Addendum)
 [facility]    CSN: [id number] Arrival date & time: 09/26/24  0913      History   Chief Complaint Chief Complaint  Patient presents with   Exposure to STD    HPI Steven Deleon is a 28 y.o. male.    Exposure to STD  Told he was exposed to gonorrhea. No sx: no abd pain, penile discharge, genital lesion, n/v, chills, dysuria. WAnts testing and treatment.   History reviewed. No pertinent past medical history.  There are no active problems to display for this patient.   Past Surgical History:  Procedure Laterality Date   APPENDECTOMY         Home Medications    Prior to Admission medications   Not on File    Family History History reviewed. No pertinent family history.  Social History Social History   Tobacco Use   Smoking status: Every Day    Types: Cigars    Passive exposure: Past   Smokeless tobacco: Never  Vaping Use   Vaping status: Never Used  Substance Use Topics   Alcohol use: Yes    Comment: Socially   Drug use: Yes    Types: Marijuana     Allergies   Patient has no known allergies.   Review of Systems Review of Systems   Physical Exam Triage Vital Signs ED Triage Vitals  Encounter Vitals Group     BP 09/26/24 0949 (!) 156/103     Girls Systolic BP Percentile --      Girls Diastolic BP Percentile --      Boys Systolic BP Percentile --      Boys Diastolic BP Percentile --      Pulse Rate 09/26/24 0949 80     Resp 09/26/24 0949 16     Temp 09/26/24 0949 98.5 F (36.9 C)     Temp Source 09/26/24 0949 Oral     SpO2 09/26/24 0949 96 %     Weight --      Height --      Head Circumference --      Peak Flow --      Pain Score 09/26/24 0950 0     Pain Loc --      Pain Education --      Exclude from Growth Chart --    No data found.  Updated Vital Signs BP (!) 156/103 (BP Location: Left Arm)   Pulse 80   Temp 98.5 F (36.9 C) (Oral)   Resp 16   SpO2 96%   Visual Acuity Right Eye Distance:    Left Eye Distance:   Bilateral Distance:    Right Eye Near:   Left Eye Near:    Bilateral Near:     Physical Exam Constitutional:      Appearance: Normal appearance.  Pulmonary:     Effort: Pulmonary effort is normal.  Neurological:     Mental Status: He is alert and oriented to person, place, and time.  Psychiatric:        Mood and Affect: Mood normal.      UC Treatments / Results  Labs (all labs ordered are listed, but only abnormal results are displayed) Labs Reviewed  SYPHILIS: RPR W/REFLEX TO RPR TITER AND TREPONEMAL ANTIBODIES, TRADITIONAL SCREENING AND DIAGNOSIS ALGORITHM  HIV ANTIBODY (ROUTINE TESTING W REFLEX)  CYTOLOGY, (ORAL, ANAL, URETHRAL) ANCILLARY ONLY    EKG   Radiology No results found.  Procedures Procedures (including critical care time)  Medications Ordered  in UC Medications  cefTRIAXone  (ROCEPHIN ) injection 500 mg (500 mg Intramuscular Given 09/26/24 1033)    Initial Impression / Assessment and Plan / UC Course  I have reviewed the triage vital signs and the nursing notes.  Pertinent labs & imaging results that were available during my care of the patient were reviewed by me and considered in my medical decision making (see chart for details).     Given known exposure, will treat for gc. Pt understands he can check testing results in mychart.   REcheck BP similar to elevated reading on arrival. Pt is anxious about being here in medical facility, getting injection and blood draw. He should have it rechecked or check it himself either at home or such as at a drug store, and if it remains high, seek f/u. CMA notified pt of this.    Final Clinical Impressions(s) / UC Diagnoses   Final diagnoses:  STD exposure     Discharge Instructions      You will get a call if tests are positive or abnormal, you will not get a call if tests are negative  or normal but you can check results in MyChart if you have a MyChart account.       ED  Prescriptions   None    PDMP not reviewed this encounter.   Richad Jon HERO, NP 09/26/24 1040    Richad Jon HERO, NP 09/26/24 1048    Richad Jon HERO, NP 09/26/24 1053

## 2024-09-27 LAB — SYPHILIS: RPR W/REFLEX TO RPR TITER AND TREPONEMAL ANTIBODIES, TRADITIONAL SCREENING AND DIAGNOSIS ALGORITHM: RPR Ser Ql: NONREACTIVE

## 2024-09-29 LAB — CYTOLOGY, (ORAL, ANAL, URETHRAL) ANCILLARY ONLY
Chlamydia: NEGATIVE
Comment: NEGATIVE
Comment: NEGATIVE
Comment: NORMAL
Neisseria Gonorrhea: NEGATIVE
Trichomonas: NEGATIVE

## 2024-11-21 ENCOUNTER — Other Ambulatory Visit: Payer: Self-pay

## 2024-11-21 ENCOUNTER — Encounter (HOSPITAL_COMMUNITY): Payer: Self-pay

## 2024-11-21 ENCOUNTER — Emergency Department (HOSPITAL_COMMUNITY)
Admission: EM | Admit: 2024-11-21 | Discharge: 2024-11-22 | Disposition: A | Discharge status: Home / Self Care | Attending: Emergency Medicine | Admitting: Emergency Medicine

## 2024-11-21 DIAGNOSIS — R030 Elevated blood-pressure reading, without diagnosis of hypertension: Secondary | ICD-10-CM | POA: Diagnosis not present

## 2024-11-21 DIAGNOSIS — R369 Urethral discharge, unspecified: Secondary | ICD-10-CM | POA: Diagnosis present

## 2024-11-21 DIAGNOSIS — N342 Other urethritis: Secondary | ICD-10-CM | POA: Insufficient documentation

## 2024-11-21 LAB — URINALYSIS, ROUTINE W REFLEX MICROSCOPIC
Bilirubin Urine: NEGATIVE
Glucose, UA: NEGATIVE mg/dL
Hgb urine dipstick: NEGATIVE
Ketones, ur: NEGATIVE mg/dL
Nitrite: NEGATIVE
Protein, ur: NEGATIVE mg/dL
Specific Gravity, Urine: 1.027 (ref 1.005–1.030)
WBC, UA: >50 WBC/hpf (ref 0–5)
pH: 5 (ref 5.0–8.0)

## 2024-11-21 NOTE — ED Triage Notes (Signed)
 Pt here for STD testing states he is having discharge that started today.

## 2024-11-22 MED ORDER — DOXYCYCLINE HYCLATE 100 MG PO CAPS: 100.0000 mg | ORAL_CAPSULE | Freq: Two times a day (BID) | ORAL | 0 refills | Status: AC

## 2024-11-22 MED ORDER — AZITHROMYCIN 250 MG PO TABS
1000.0000 mg | ORAL_TABLET | Freq: Once | ORAL | Status: AC
  Administered 2024-11-22: 1000 mg via ORAL
  Filled 2024-11-22: qty 4

## 2024-11-22 MED ORDER — LIDOCAINE HCL (PF) 1 % IJ SOLN
INTRAMUSCULAR | Status: AC
  Administered 2024-11-22: 5 mL
  Filled 2024-11-22: qty 5

## 2024-11-22 MED ORDER — CEFTRIAXONE SODIUM 500 MG IJ SOLR
500.0000 mg | Freq: Once | INTRAMUSCULAR | Status: AC
  Administered 2024-11-22: 500 mg via INTRAMUSCULAR
  Filled 2024-11-22: qty 500

## 2024-11-22 NOTE — ED Provider Notes (Signed)
 " [place] [facility] Provider Note   CSN: [id number] Arrival date & time: 11/21/24  1759     Patient presents with: No chief complaint on file.   Steven Deleon is a 29 y.o. male.   Patient to ED with concern for STD reporting penile discharge since yesterday. No fever, nausea or vomiting. He denies abdominal pain.   The history is provided by the patient. No language interpreter was used.       Prior to Admission medications  Medication Sig Start Date End Date Taking? Authorizing Provider  doxycycline  (VIBRAMYCIN ) 100 MG capsule Take 1 capsule (100 mg total) by mouth 2 (two) times daily. 11/22/24  Yes Odell Balls, PA-C    Allergies: Patient has no known allergies.    Review of Systems  Updated Vital Signs BP (!) 175/105   Pulse (!) 58   Temp 98.1 F (36.7 C) (Oral)   Resp 20   Ht 5' 10 (1.778 m)   Wt 86.2 kg   SpO2 99%   BMI 27.26 kg/m   Physical Exam Vitals and nursing note reviewed.  Constitutional:      Appearance: Normal appearance. He is well-developed.  Pulmonary:     Effort: Pulmonary effort is normal.  Genitourinary:    Comments: Patient deferred.  Musculoskeletal:        General: Normal range of motion.     Cervical back: Normal range of motion.  Skin:    General: Skin is warm and dry.  Neurological:     Mental Status: He is alert and oriented to person, place, and time.     (all labs ordered are listed, but only abnormal results are displayed) Labs Reviewed  URINALYSIS, ROUTINE W REFLEX MICROSCOPIC - Abnormal; Notable for the following components:      Result Value   APPearance HAZY (*)    Leukocytes,Ua MODERATE (*)    Bacteria, UA FEW (*)    All other components within normal limits  GC/CHLAMYDIA PROBE AMP ([place]) NOT AT [facility]    EKG: None  Radiology: No results found.   Procedures   Medications Ordered in the ED  cefTRIAXone  (ROCEPHIN ) injection 500 mg (has no  administration in time range)  azithromycin  (ZITHROMAX ) tablet 1,000 mg (has no administration in time range)    Clinical Course as of 11/22/24 0038  Sat Nov 22, 2024  0028 Recurrent STI visits.  Being tested.  Declined blood work.  Chronically hypertensive.  Demonstrated on prior visits.  Patient educated.  Need to establish outpatient care for long-term management of this condition.  Empirically starting medicine without blood work and without patient demonstrating plan for long-term care would be unreasonable.  No evidence of hypertensive crisis on exam or history at this time. [CC]  0028 Patient with multiple previous ED visits, many involving concern/treatment for STD, here with recurrent penile discharge. No fever. Urine c/w >50 WBC, few bacteria. Favor STD over UTI. Discussed HIV and syphilis and patient declined.   Noted to have elevated BP. Chart reviewed. BP consistently elevated on multiple charts. Discussed beginning medications with Dr. Jerral who recommends follow up with PCP for this issue.  [SU]    Clinical Course User Index [CC] Jerral Meth, MD [SU] Odell Balls, PA-C                                 Medical Decision Making Amount and/or Complexity  of Data Reviewed Labs: ordered.  Risk Prescription drug management.        Final diagnoses:  Urethritis  Elevated blood pressure reading    ED Discharge Orders          Ordered    doxycycline  (VIBRAMYCIN ) 100 MG capsule  2 times daily        11/22/24 0038               Odell Balls, PA-C 11/22/24 0038    Jerral Meth, MD 11/22/24 0630  "

## 2024-11-22 NOTE — Discharge Instructions (Signed)
 It is important to follow up with a primary care physician to have your blood pressure rechecked and treated in the outpatient setting.   With regard to your symptoms today, take the antibiotic as prescribed. Your primary care doctor can offer recheck if symptoms continue.

## 2024-11-24 LAB — GC/CHLAMYDIA PROBE AMP (~~LOC~~) NOT AT ARMC
Chlamydia: NEGATIVE
Comment: NEGATIVE
Comment: NORMAL
Neisseria Gonorrhea: POSITIVE — AB

## 2024-12-01 ENCOUNTER — Ambulatory Visit (HOSPITAL_COMMUNITY): Payer: Self-pay

## 2024-12-01 NOTE — Telephone Encounter (Signed)
 Results faxed to [facility]
